# Patient Record
Sex: Female | Born: 1960 | Race: White | Hispanic: No | Smoking: Never smoker
Health system: Southern US, Community
[De-identification: ages and names within clinical notes are randomized; demographics above are authoritative.]

## PROBLEM LIST (undated history)

## (undated) DIAGNOSIS — C801 Malignant (primary) neoplasm, unspecified: Secondary | ICD-10-CM

## (undated) DIAGNOSIS — Z923 Personal history of irradiation: Secondary | ICD-10-CM

## (undated) DIAGNOSIS — D219 Benign neoplasm of connective and other soft tissue, unspecified: Secondary | ICD-10-CM

## (undated) DIAGNOSIS — I10 Essential (primary) hypertension: Secondary | ICD-10-CM

## (undated) DIAGNOSIS — N879 Dysplasia of cervix uteri, unspecified: Secondary | ICD-10-CM

## (undated) DIAGNOSIS — J45909 Unspecified asthma, uncomplicated: Secondary | ICD-10-CM

## (undated) HISTORY — DX: Malignant (primary) neoplasm, unspecified: C80.1

## (undated) HISTORY — DX: Dysplasia of cervix uteri, unspecified: N87.9

## (undated) HISTORY — PX: PILONIDAL CYST EXCISION: SHX744

## (undated) HISTORY — PX: BREAST SURGERY: SHX581

## (undated) HISTORY — DX: Benign neoplasm of connective and other soft tissue, unspecified: D21.9

## (undated) HISTORY — DX: Essential (primary) hypertension: I10

---

## 1998-08-29 ENCOUNTER — Other Ambulatory Visit: Admission: RE | Admit: 1998-08-29 | Discharge: 1998-08-29 | Payer: Self-pay | Admitting: Gynecology

## 1999-10-25 ENCOUNTER — Other Ambulatory Visit: Admission: RE | Admit: 1999-10-25 | Discharge: 1999-10-25 | Payer: Self-pay | Admitting: Gynecology

## 2000-10-28 ENCOUNTER — Other Ambulatory Visit: Admission: RE | Admit: 2000-10-28 | Discharge: 2000-10-28 | Payer: Self-pay | Admitting: Gynecology

## 2001-12-16 ENCOUNTER — Other Ambulatory Visit: Admission: RE | Admit: 2001-12-16 | Discharge: 2001-12-16 | Payer: Self-pay | Admitting: Gynecology

## 2002-09-30 ENCOUNTER — Other Ambulatory Visit: Admission: RE | Admit: 2002-09-30 | Discharge: 2002-09-30 | Payer: Self-pay | Admitting: Gynecology

## 2003-05-12 ENCOUNTER — Other Ambulatory Visit: Admission: RE | Admit: 2003-05-12 | Discharge: 2003-05-12 | Payer: Self-pay | Admitting: Gynecology

## 2004-04-23 ENCOUNTER — Emergency Department (HOSPITAL_COMMUNITY): Admission: EM | Admit: 2004-04-23 | Discharge: 2004-04-23 | Payer: Self-pay | Admitting: Emergency Medicine

## 2004-06-08 ENCOUNTER — Other Ambulatory Visit: Admission: RE | Admit: 2004-06-08 | Discharge: 2004-06-08 | Payer: Self-pay | Admitting: Gynecology

## 2005-08-21 ENCOUNTER — Other Ambulatory Visit: Admission: RE | Admit: 2005-08-21 | Discharge: 2005-08-21 | Payer: Self-pay | Admitting: Gynecology

## 2006-09-23 ENCOUNTER — Other Ambulatory Visit: Admission: RE | Admit: 2006-09-23 | Discharge: 2006-09-23 | Payer: Self-pay | Admitting: Gynecology

## 2007-05-08 ENCOUNTER — Other Ambulatory Visit: Admission: RE | Admit: 2007-05-08 | Discharge: 2007-05-08 | Payer: Self-pay | Admitting: Gynecology

## 2007-10-13 ENCOUNTER — Other Ambulatory Visit: Admission: RE | Admit: 2007-10-13 | Discharge: 2007-10-13 | Payer: Self-pay | Admitting: Gynecology

## 2008-12-23 ENCOUNTER — Encounter: Payer: Self-pay | Admitting: Gynecology

## 2008-12-23 ENCOUNTER — Other Ambulatory Visit: Admission: RE | Admit: 2008-12-23 | Discharge: 2008-12-23 | Payer: Self-pay | Admitting: Gynecology

## 2008-12-23 ENCOUNTER — Ambulatory Visit: Payer: Self-pay | Admitting: Gynecology

## 2010-02-21 ENCOUNTER — Other Ambulatory Visit: Admission: RE | Admit: 2010-02-21 | Discharge: 2010-02-21 | Payer: Self-pay | Admitting: Gynecology

## 2010-02-21 ENCOUNTER — Ambulatory Visit: Payer: Self-pay | Admitting: Gynecology

## 2010-03-01 ENCOUNTER — Ambulatory Visit: Payer: Self-pay | Admitting: Gynecology

## 2011-02-25 ENCOUNTER — Other Ambulatory Visit: Payer: Self-pay | Admitting: *Deleted

## 2011-02-25 DIAGNOSIS — IMO0001 Reserved for inherently not codable concepts without codable children: Secondary | ICD-10-CM

## 2011-02-25 MED ORDER — NORETHIN ACE-ETH ESTRAD-FE 1-20 MG-MCG(24) PO TABS: 1.0000 | ORAL_TABLET | Freq: Every day | ORAL | Status: DC

## 2011-02-27 ENCOUNTER — Telehealth: Payer: Self-pay | Admitting: *Deleted

## 2011-03-05 NOTE — Telephone Encounter (Signed)
PT CALLED BACK TO ASK FOR COUPON FOR LOESTRIN COUPON. PT INFORMED THAT NO COUPONS IN OFFICE ANYMORE. I TOLD PT SHE COULD PROBABLY FIND ONE ON-LINE. PT STATES SHE WILL DO THIS.

## 2011-03-20 ENCOUNTER — Other Ambulatory Visit: Payer: Self-pay | Admitting: Gynecology

## 2011-04-12 ENCOUNTER — Other Ambulatory Visit (HOSPITAL_COMMUNITY)
Admission: RE | Admit: 2011-04-12 | Discharge: 2011-04-12 | Disposition: A | Discharge status: Home / Self Care | Payer: 59 | Source: Ambulatory Visit | Attending: Gynecology | Admitting: Gynecology

## 2011-04-12 ENCOUNTER — Encounter: Payer: Self-pay | Admitting: Gynecology

## 2011-04-12 ENCOUNTER — Ambulatory Visit (INDEPENDENT_AMBULATORY_CARE_PROVIDER_SITE_OTHER): Payer: 59 | Admitting: Gynecology

## 2011-04-12 VITALS — BP 140/80 | Ht 65.25 in | Wt 133.0 lb

## 2011-04-12 DIAGNOSIS — Z01419 Encounter for gynecological examination (general) (routine) without abnormal findings: Secondary | ICD-10-CM | POA: Insufficient documentation

## 2011-04-12 DIAGNOSIS — R82998 Other abnormal findings in urine: Secondary | ICD-10-CM

## 2011-04-12 DIAGNOSIS — Z131 Encounter for screening for diabetes mellitus: Secondary | ICD-10-CM

## 2011-04-12 DIAGNOSIS — N63 Unspecified lump in unspecified breast: Secondary | ICD-10-CM

## 2011-04-12 DIAGNOSIS — Z13220 Encounter for screening for lipoid disorders: Secondary | ICD-10-CM

## 2011-04-12 DIAGNOSIS — Z3041 Encounter for surveillance of contraceptive pills: Secondary | ICD-10-CM

## 2011-04-12 MED ORDER — NORETHIN ACE-ETH ESTRAD-FE 1-20 MG-MCG(24) PO TABS: 1.0000 | ORAL_TABLET | Freq: Every day | ORAL | Status: DC

## 2011-04-12 NOTE — Progress Notes (Signed)
Amy Fisher Sep 01, 1960 [id number]        50 y.o.  for annual exam.  On oral contraceptives without complaints. 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 In 2009 she had ASCUS. In 2010 and 2011 there were normal.  Past medical history,surgical history, medications, allergies, family history and social history were all reviewed and documented in the EPIC chart. ROS:  Was performed and pertinent positives and negatives are included in the history.  Exam: chaperone present Filed Vitals:   04/12/11 0940  BP: 140/80   General appearance  Normal Skin grossly normal Head/Neck normal with no cervical or supraclavicular adenopathy thyroid normal Lungs  clear Cardiac RR, without RMG Abdominal  soft, nontender, without masses, organomegaly or hernia EXT Mole left 4th toe Breasts  examined lying and sitting. Right without masses, retractions, discharge or axillary adenopathy.  Left with classic sebaceous cyst 3:00 position of the areola proximately 7 mm. Mobile nontender. Breast otherwise without masses retractions discharge axillary adenopathy. Pelvic  Ext/BUS/vagina  normal   Cervix  normal  Pap done  Uterus  anteverted, normal size, shape and contour, midline and mobile nontender   Adnexa  Without masses or tenderness    Anus and perineum  normal   Rectovaginal  normal sphincter tone without palpated masses or tenderness.    Assessment/Plan:  50 y.o. female for annual exam.    #1 Left breast mass. Exam of systems classic sebaceous cyst. Patient states it's been there for years although I never noted this before on exam. She has not had a mammography in several years. Will schedule a mammography as well as ultrasound. I discussed various options to include observation and excision.  As she has stated, that It has been present for a number of years she prefers just to watch it assuming that the mammogram and ultrasound are consistent with benign changes. #2 Birth control.  Patient is on low-dose oral contraceptives and wants to continue. I discussed the risks to include stroke, heart attack, DVT. Patient understands accepts and wants to continue. I refilled her Loestrin 24x1 year. She does have a mildly elevated blood pressure.  I asked her to have it rechecked in a nonexam situation to make sure it's normal and she agrees to do so. #3 Health maintenance. Will check baseline CBC glucose lipid profile and urinalysis. She has a mole on her left toe she has had her whole life has remained unchanged and she is continuing to observe this. Assuming she continues well from a gynecologic standpoint and that her breast studies are all benign she will see me in a year sooner as needed    Dara Lords MD, 10:07 AM 04/12/2011

## 2011-05-03 ENCOUNTER — Ambulatory Visit
Admission: RE | Admit: 2011-05-03 | Discharge: 2011-05-03 | Disposition: A | Discharge status: Home / Self Care | Payer: 59 | Source: Ambulatory Visit | Attending: Gynecology | Admitting: Gynecology

## 2011-05-03 DIAGNOSIS — N63 Unspecified lump in unspecified breast: Secondary | ICD-10-CM

## 2012-06-03 ENCOUNTER — Ambulatory Visit (INDEPENDENT_AMBULATORY_CARE_PROVIDER_SITE_OTHER): Payer: 59 | Admitting: Gynecology

## 2012-06-03 ENCOUNTER — Encounter: Payer: Self-pay | Admitting: Gynecology

## 2012-06-03 VITALS — BP 136/90 | Ht 65.0 in | Wt 134.0 lb

## 2012-06-03 DIAGNOSIS — Z13220 Encounter for screening for lipoid disorders: Secondary | ICD-10-CM

## 2012-06-03 DIAGNOSIS — Z01419 Encounter for gynecological examination (general) (routine) without abnormal findings: Secondary | ICD-10-CM

## 2012-06-03 DIAGNOSIS — Z3041 Encounter for surveillance of contraceptive pills: Secondary | ICD-10-CM

## 2012-06-03 DIAGNOSIS — D259 Leiomyoma of uterus, unspecified: Secondary | ICD-10-CM

## 2012-06-03 LAB — CBC WITH DIFFERENTIAL/PLATELET
Eosinophils Relative: 3 % (ref 0–5)
Lymphocytes Relative: 32 % (ref 12–46)
Lymphs Abs: 1.7 10*3/uL (ref 0.7–4.0)
MCV: 87.1 fL (ref 78.0–100.0)
Neutro Abs: 3.2 10*3/uL (ref 1.7–7.7)
Neutrophils Relative %: 60 % (ref 43–77)
Platelets: 192 10*3/uL (ref 150–400)
RBC: 5.11 MIL/uL (ref 3.87–5.11)
WBC: 5.3 10*3/uL (ref 4.0–10.5)

## 2012-06-03 LAB — COMPREHENSIVE METABOLIC PANEL
ALT: 13 U/L (ref 0–35)
Albumin: 4.6 g/dL (ref 3.5–5.2)
CO2: 25 mEq/L (ref 19–32)
Calcium: 9.6 mg/dL (ref 8.4–10.5)
Chloride: 105 mEq/L (ref 96–112)
Potassium: 4.5 mEq/L (ref 3.5–5.3)
Sodium: 138 mEq/L (ref 135–145)
Total Protein: 6.8 g/dL (ref 6.0–8.3)

## 2012-06-03 LAB — LIPID PANEL: Cholesterol: 190 mg/dL (ref 0–200)

## 2012-06-03 MED ORDER — NORETHIN ACE-ETH ESTRAD-FE 1-20 MG-MCG(24) PO TABS: 1.0000 | ORAL_TABLET | Freq: Every day | ORAL | Status: DC

## 2012-06-03 NOTE — Progress Notes (Signed)
Amy Fisher 1960/08/15 [id number]        51 y.o.  G0P0 for annual exam.  Several issues that are below.  Past medical history,surgical history, medications, allergies, family history and social history were all reviewed and documented in the EPIC chart. ROS:  Was performed and pertinent positives and negatives are included in the history.  Exam: Sherrilyn Rist assistant Filed Vitals:   06/03/12 0859  BP: 136/90  Height: 5\' 5"  (1.651 m)  Weight: 134 lb (60.782 kg)   General appearance  Normal Skin grossly normal Head/Neck normal with no cervical or supraclavicular adenopathy thyroid normal Lungs  clear Cardiac RR, without RMG Abdominal  soft, nontender, without masses, organomegaly or hernia Breasts  examined lying and sitting. Right without masses, retractions, discharge or axillary adenopathy.  Left with small bluish cyst 4:00 position 1 fingerbreadth off the areola. No other masses retractions discharge adenopathy. Pelvic  Ext/BUS/vagina  normal   Cervix  normal   Uterus  anteverted, normal size, shape and contour, midline and mobile nontender   Adnexa  Without masses or tenderness    Anus and perineum  normal   Rectovaginal  normal sphincter tone without palpated masses or tenderness.    Assessment/Plan:  51 y.o. G0P0 female for annual exam.   1. Birth control. Patient on Loestrin 120 equivalents. Reviewed risks to include stroke heart attack DVT. Alternatives to include pill patch ring Depo-Provera and Implanon IUD discussed. Blood pressure mildly elevated at 136/90. She does have some labile blood pressures as she checks them at home and they'll oscillate between normal to mildly elevated. Recommended she strongly consider Mirena IUD as an alternative birth control. I reviewed the risks benefits to include infection, perforation/migration requiring surgery to remove, failure risk. Insertional process also discussed.  I did refill her birth control pills at this point as she does want  to continue them. She's going to continue to monitor her blood pressure. If it does remain elevated she follow up Dr. Foy Guadalajara who is her primary physician.  She will follow for IUD placement if she decides to pursue this. 2. Elevated blood pressure. Plan as noted above. 3. Leiomyoma. 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 Had an episode of irregular bleeding this summer. Now having regular menses. Will check baseline ultrasound. 4. Mammography. Patient overdo and she knows to schedule and agrees to do so. SBE reviewed. Small cutaneous cyst left breast evaluated with ultrasound last year consistent with benign etiology. Patient will continue to follow, as long as it remains unchanged she is comfortable following. 5. Pap smear.  No Pap smear done today.  History of LGSIL. 2008, ascus 2009, normal 2010, 2011, 2012.  Plan every 3 year screening. 6. Health maintenance. Baseline CBC comprehensive metabolic panel lipid profile urinalysis ordered. Follow up for ultrasound, possible Mirena IUD.    Dara Lords MD, 9:42 AM 06/03/2012

## 2012-06-03 NOTE — Patient Instructions (Addendum)
Followup for ultrasound as scheduled. Followup in one year for annual exam. 

## 2012-06-04 LAB — URINALYSIS W MICROSCOPIC + REFLEX CULTURE
Bilirubin Urine: NEGATIVE
Crystals: NONE SEEN
Glucose, UA: NEGATIVE mg/dL
Leukocytes, UA: NEGATIVE
Specific Gravity, Urine: 1.029 (ref 1.005–1.030)

## 2012-06-10 ENCOUNTER — Telehealth: Payer: Self-pay | Admitting: Gynecology

## 2012-06-10 ENCOUNTER — Ambulatory Visit (INDEPENDENT_AMBULATORY_CARE_PROVIDER_SITE_OTHER): Payer: 59 | Admitting: Gynecology

## 2012-06-10 ENCOUNTER — Encounter: Payer: Self-pay | Admitting: Gynecology

## 2012-06-10 ENCOUNTER — Ambulatory Visit (INDEPENDENT_AMBULATORY_CARE_PROVIDER_SITE_OTHER): Payer: 59

## 2012-06-10 ENCOUNTER — Other Ambulatory Visit: Payer: Self-pay | Admitting: Gynecology

## 2012-06-10 DIAGNOSIS — D251 Intramural leiomyoma of uterus: Secondary | ICD-10-CM

## 2012-06-10 DIAGNOSIS — D259 Leiomyoma of uterus, unspecified: Secondary | ICD-10-CM

## 2012-06-10 DIAGNOSIS — N925 Other specified irregular menstruation: Secondary | ICD-10-CM

## 2012-06-10 DIAGNOSIS — N949 Unspecified condition associated with female genital organs and menstrual cycle: Secondary | ICD-10-CM

## 2012-06-10 DIAGNOSIS — Z3049 Encounter for surveillance of other contraceptives: Secondary | ICD-10-CM

## 2012-06-10 DIAGNOSIS — N852 Hypertrophy of uterus: Secondary | ICD-10-CM

## 2012-06-10 MED ORDER — LEVONORGESTREL 20 MCG/24HR IU IUD: INTRAUTERINE_SYSTEM | Freq: Once | INTRAUTERINE | Status: DC

## 2012-06-10 NOTE — Progress Notes (Signed)
Patient also for ultrasound. Has history of leiomyoma and some irregular bleeding this past summer but now is regular. Ultrasound: uterus overall normal to generous size. Endometrium 2.9 mm. Single 50 mm myoma. Right/left ovaries normal.  Assessment and plan: Single myoma overall stable in size. Patient has decided she was to proceed with Mirena IUD per prior 06/03/2012 discussion. I reviewed was involved with the insertional process and the risks to include perforation/migration requiring surgery to remove, infection both immediate and long-term, failure with pregnancy. The issues of myoma distorting the cavity possibly making placement more difficult/increasing expulsion rate/decreased efficacy rate all reviewed. Patient will schedule with her menses and follow up for this.

## 2012-06-10 NOTE — Patient Instructions (Signed)
Followup for IUD placement.  Intrauterine Device Insertion Most often, an intrauterine device (IUD) is inserted into the uterus to prevent pregnancy. There are 2 types of IUDs available:  Copper IUD. This type of IUD creates an environment that is not favorable to sperm survival. The mechanism of action of the copper IUD is not known for certain. It can stay in place for 10 years.  Hormone IUD. This type of IUD contains the hormone progestin (synthetic progesterone). The progestin thickens the cervical mucus and prevents sperm from entering the uterus, and it also thins the uterine lining. There is no evidence that the hormone IUD prevents implantation. The hormone IUD can stay in place for up to 5 years. An IUD is the most cost-effective birth control if left in place for the full duration. It may be removed at any time. LET YOUR CAREGIVER KNOW ABOUT:  Sensitivity to metals.  Medicines taken including herbs, eyedrops, over-the-counter medicines, and creams.  Use of steroids (by mouth or creams).  Previous problems with anesthetics or numbing medicine.  Previous gynecological surgery.  History of blood clots or clotting disorders.  Possibility of pregnancy.  Menstrual irregularities.  Concerns regarding unusual vaginal discharge or odors.  Previous experience with an IUD.  Other health problems. RISKS AND COMPLICATIONS  Accidental puncture (perforation) of the uterus.  Accidental placement of the IUD either in the muscle layer of the uterus (myometrium) or outside the uterus. If this happen, the IUD can be found essentially floating around the bowels. When this happens, the IUD must be taken out surgically.  The IUD may fall out of the uterus (expulsion). This is more common in women who have recently had a child.   Pregnancy in the fallopian tube (ectopic). BEFORE THE PROCEDURE  Schedule the IUD insertion for when you will have your menstrual period or right after, to  make sure you are not pregnant. Placement of the IUD is better tolerated shortly after a menstrual cycle.  You may need to take tests or be examined to make sure you are not pregnant.  You may be required to take a pregnancy test.  You may be required to get checked for sexually transmitted infections (STIs) prior to placement. Placing an IUD in someone who has an infection can make an infection worse.  You may be given a pain reliever to take 1 or 2 hours before the procedure.  An exam will be performed to determine the size and position of your uterus.  Ask your caregiver about changing or stopping your regular medicines. PROCEDURE   A tool (speculum) is placed in the vagina. This allows your caregiver to see the lower part of the uterus (cervix).  The cervix is prepped with a medicine that lowers the risk of infection.  You may be given a medicine to numb each side of the cervix (intracervical or paracervical block). This is used to block and control any discomfort with insertion.  A tool (uterine sound) is inserted into the uterus to determine the length of the uterine cavity and the direction the uterus may be tilted.  A slim instrument (IUD inserter) is inserted through the cervical canal and into your uterus.  The IUD is placed in the uterine cavity and the insertion device is removed.  The nylon string that is attached to the IUD, and used for eventual IUD removal, is trimmed. It is trimmed so that it lays high in the vagina, just outside the cervix. AFTER THE PROCEDURE  You   may have bleeding after the procedure. This is normal. It varies from light spotting for a few days to menstrual-like bleeding.  You may have mild cramping.  Practice checking the string coming out of the cervix to make sure the IUD remains in the uterus. If you cannot feel the string, you should schedule a "string check" with your caregiver.  If you had a hormone IUD inserted, expect that your period  may be lighter or nonexistent within a year's time (though this is not always the case). There may be delayed fertility with the hormone IUD as a result of its progesterone effect. When you are ready to become pregnant, it is suggested to have the IUD removed up to 1 year in advance.  Yearly exams are advised. Document Released: 03/06/2011 Document Revised: 09/30/2011 Document Reviewed: 03/06/2011 [employer] Patient Information 2013 [employer].  

## 2012-06-10 NOTE — Telephone Encounter (Signed)
I spoke w/pt today who asked to have Mirena benefits checked. Per Jacquenette Shone at [facility] it is covered at 100%,no copay,deductible or coinsurance. Pt wants to proceed and will call first day of next cycle.WL

## 2012-06-25 ENCOUNTER — Encounter: Payer: Self-pay | Admitting: Gynecology

## 2012-06-25 ENCOUNTER — Ambulatory Visit (INDEPENDENT_AMBULATORY_CARE_PROVIDER_SITE_OTHER): Payer: 59 | Admitting: Gynecology

## 2012-06-25 DIAGNOSIS — Z30430 Encounter for insertion of intrauterine contraceptive device: Secondary | ICD-10-CM

## 2012-06-25 MED ORDER — LEVONORGESTREL 20 MCG/24HR IU IUD: INTRAUTERINE_SYSTEM | Freq: Once | INTRAUTERINE | Status: DC

## 2012-06-25 NOTE — Progress Notes (Signed)
Patient presents for Mirena IUD placement. She has read through the booklet, has no contraindications and signed the consent form. She currently is on a normal menses during her pill free week.  I reviewed the insertional process with her as well as the risks to include infection either immediate or long-term, uterine perforation or migration requiring surgery to remove, other complications such as pain, hormonal side effects and possibilities of failure with subsequent pregnancy.  We again discussed her leiomyoma and the question as to whether the IUD will be less efficacious due to endometrial distortion from the myoma as well as increased expulsion rate and she understands these issues and accepts the risks.  On ultrasound there was some deviation of the cavity by the myoma but was not intracavitary or showing significant distortion.  Exam with Sherrilyn Rist assistant Pelvic: External BUS vagina normal. Cervix normal light menses. Uterus anteverted generous in size, midline mobile nontender. Adnexa without masses or tenderness.  Procedure: The cervix was cleansed with Betadine, anterior lip grasped with a single-tooth tenaculum, the uterus was sounded and a Mirena IUD was placed according to manufacturer's recommendations without difficulty. The strings were trimmed. The patient tolerated well and will follow up in one month for a postinsertional check.  Lot number: [id number]

## 2012-06-25 NOTE — Patient Instructions (Signed)
Follow up in one month for post IUD insertion exam.  Intrauterine Device Insertion Most often, an intrauterine device (IUD) is inserted into the uterus to prevent pregnancy. There are 2 types of IUDs available:  Copper IUD. This type of IUD creates an environment that is not favorable to sperm survival. The mechanism of action of the copper IUD is not known for certain. It can stay in place for 10 years.  Hormone IUD. This type of IUD contains the hormone progestin (synthetic progesterone). The progestin thickens the cervical mucus and prevents sperm from entering the uterus, and it also thins the uterine lining. There is no evidence that the hormone IUD prevents implantation. The hormone IUD can stay in place for up to 5 years. An IUD is the most cost-effective birth control if left in place for the full duration. It may be removed at any time. LET YOUR CAREGIVER KNOW ABOUT:  Sensitivity to metals.  Medicines taken including herbs, eyedrops, over-the-counter medicines, and creams.  Use of steroids (by mouth or creams).  Previous problems with anesthetics or numbing medicine.  Previous gynecological surgery.  History of blood clots or clotting disorders.  Possibility of pregnancy.  Menstrual irregularities.  Concerns regarding unusual vaginal discharge or odors.  Previous experience with an IUD.  Other health problems. RISKS AND COMPLICATIONS  Accidental puncture (perforation) of the uterus.  Accidental placement of the IUD either in the muscle layer of the uterus (myometrium) or outside the uterus. If this happen, the IUD can be found essentially floating around the bowels. When this happens, the IUD must be taken out surgically.  The IUD may fall out of the uterus (expulsion). This is more common in women who have recently had a child.   Pregnancy in the fallopian tube (ectopic). BEFORE THE PROCEDURE  Schedule the IUD insertion for when you will have your menstrual  period or right after, to make sure you are not pregnant. Placement of the IUD is better tolerated shortly after a menstrual cycle.  You may need to take tests or be examined to make sure you are not pregnant.  You may be required to take a pregnancy test.  You may be required to get checked for sexually transmitted infections (STIs) prior to placement. Placing an IUD in someone who has an infection can make an infection worse.  You may be given a pain reliever to take 1 or 2 hours before the procedure.  An exam will be performed to determine the size and position of your uterus.  Ask your caregiver about changing or stopping your regular medicines. PROCEDURE   A tool (speculum) is placed in the vagina. This allows your caregiver to see the lower part of the uterus (cervix).  The cervix is prepped with a medicine that lowers the risk of infection.  You may be given a medicine to numb each side of the cervix (intracervical or paracervical block). This is used to block and control any discomfort with insertion.  A tool (uterine sound) is inserted into the uterus to determine the length of the uterine cavity and the direction the uterus may be tilted.  A slim instrument (IUD inserter) is inserted through the cervical canal and into your uterus.  The IUD is placed in the uterine cavity and the insertion device is removed.  The nylon string that is attached to the IUD, and used for eventual IUD removal, is trimmed. It is trimmed so that it lays high in the vagina, just outside the  cervix. AFTER THE PROCEDURE  You may have bleeding after the procedure. This is normal. It varies from light spotting for a few days to menstrual-like bleeding.  You may have mild cramping.  Practice checking the string coming out of the cervix to make sure the IUD remains in the uterus. If you cannot feel the string, you should schedule a "string check" with your caregiver.  If you had a hormone IUD  inserted, expect that your period may be lighter or nonexistent within a year's time (though this is not always the case). There may be delayed fertility with the hormone IUD as a result of its progesterone effect. When you are ready to become pregnant, it is suggested to have the IUD removed up to 1 year in advance.  Yearly exams are advised. Document Released: 03/06/2011 Document Revised: 09/30/2011 Document Reviewed: 03/06/2011 [facility] Patient Information 2013 [place], [place].

## 2012-07-01 ENCOUNTER — Other Ambulatory Visit: Payer: Self-pay | Admitting: Gynecology

## 2012-07-01 DIAGNOSIS — Z30430 Encounter for insertion of intrauterine contraceptive device: Secondary | ICD-10-CM

## 2012-07-21 ENCOUNTER — Encounter: Payer: Self-pay | Admitting: Gynecology

## 2012-07-21 ENCOUNTER — Ambulatory Visit (INDEPENDENT_AMBULATORY_CARE_PROVIDER_SITE_OTHER): Payer: 59 | Admitting: Gynecology

## 2012-07-21 VITALS — BP 130/82

## 2012-07-21 DIAGNOSIS — Z30431 Encounter for routine checking of intrauterine contraceptive device: Secondary | ICD-10-CM

## 2012-07-21 NOTE — Patient Instructions (Signed)
Intrauterine Device Insertion Most often, an intrauterine device (IUD) is inserted into the uterus to prevent pregnancy. There are 2 types of IUDs available:  Copper IUD. This type of IUD creates an environment that is not favorable to sperm survival. The mechanism of action of the copper IUD is not known for certain. It can stay in place for 10 years.  Hormone IUD. This type of IUD contains the hormone progestin (synthetic progesterone). The progestin thickens the cervical mucus and prevents sperm from entering the uterus, and it also thins the uterine lining. There is no evidence that the hormone IUD prevents implantation. The hormone IUD can stay in place for up to 5 years. An IUD is the most cost-effective birth control if left in place for the full duration. It may be removed at any time. LET YOUR CAREGIVER KNOW ABOUT:  Sensitivity to metals.  Medicines taken including herbs, eyedrops, over-the-counter medicines, and creams.  Use of steroids (by mouth or creams).  Previous problems with anesthetics or numbing medicine.  Previous gynecological surgery.  History of blood clots or clotting disorders.  Possibility of pregnancy.  Menstrual irregularities.  Concerns regarding unusual vaginal discharge or odors.  Previous experience with an IUD.  Other health problems. RISKS AND COMPLICATIONS  Accidental puncture (perforation) of the uterus.  Accidental placement of the IUD either in the muscle layer of the uterus (myometrium) or outside the uterus. If this happen, the IUD can be found essentially floating around the bowels. When this happens, the IUD must be taken out surgically.  The IUD may fall out of the uterus (expulsion). This is more common in women who have recently had a child.   Pregnancy in the fallopian tube (ectopic). BEFORE THE PROCEDURE  Schedule the IUD insertion for when you will have your menstrual period or right after, to make sure you are not pregnant.  Placement of the IUD is better tolerated shortly after a menstrual cycle.  You may need to take tests or be examined to make sure you are not pregnant.  You may be required to take a pregnancy test.  You may be required to get checked for sexually transmitted infections (STIs) prior to placement. Placing an IUD in someone who has an infection can make an infection worse.  You may be given a pain reliever to take 1 or 2 hours before the procedure.  An exam will be performed to determine the size and position of your uterus.  Ask your caregiver about changing or stopping your regular medicines. PROCEDURE   A tool (speculum) is placed in the vagina. This allows your caregiver to see the lower part of the uterus (cervix).  The cervix is prepped with a medicine that lowers the risk of infection.  You may be given a medicine to numb each side of the cervix (intracervical or paracervical block). This is used to block and control any discomfort with insertion.  A tool (uterine sound) is inserted into the uterus to determine the length of the uterine cavity and the direction the uterus may be tilted.  A slim instrument (IUD inserter) is inserted through the cervical canal and into your uterus.  The IUD is placed in the uterine cavity and the insertion device is removed.  The nylon string that is attached to the IUD, and used for eventual IUD removal, is trimmed. It is trimmed so that it lays high in the vagina, just outside the cervix. AFTER THE PROCEDURE  You may have bleeding after the  attached to the IUD, and used for eventual IUD removal, is trimmed. It is trimmed so that it lays high in the vagina, just outside the cervix.  AFTER THE PROCEDURE  · You may have bleeding after the procedure. This is normal. It varies from light spotting for a few days to menstrual-like bleeding.   · You may have mild cramping.  · Practice checking the string coming out of the cervix to make sure the IUD remains in the uterus. If you cannot feel the string, you should schedule a "string check" with your caregiver.  · If you had a hormone IUD inserted, expect that your period may be lighter or nonexistent  within a year's time (though this is not always the case). There may be delayed fertility with the hormone IUD as a result of its progesterone effect. When you are ready to become pregnant, it is suggested to have the IUD removed up to 1 year in advance.  · Yearly exams are advised.  Document Released: 03/06/2011 Document Revised: 09/30/2011 Document Reviewed: 03/06/2011  [employer]® Patient Information ©2013 [employer].

## 2012-07-21 NOTE — Progress Notes (Signed)
IUD follow up exam. Doing well without complaints.  Exam with [facility] assistant External BUS vagina normal. Cervix normal IUD string not visualized but palpated. Follow up colposcopy with string visualized within the external os. Uterus normal size midline mobile nontender. Adnexa without masses or tenderness  Assessment and plan: Normal IUD follow up exam. Follow up November 2014 for annual exam, sooner if needed.

## 2012-09-05 ENCOUNTER — Other Ambulatory Visit: Payer: Self-pay

## 2013-05-27 ENCOUNTER — Other Ambulatory Visit: Payer: Self-pay

## 2013-07-20 ENCOUNTER — Ambulatory Visit (INDEPENDENT_AMBULATORY_CARE_PROVIDER_SITE_OTHER): Payer: 59 | Admitting: Gynecology

## 2013-07-20 ENCOUNTER — Other Ambulatory Visit (HOSPITAL_COMMUNITY)
Admission: RE | Admit: 2013-07-20 | Discharge: 2013-07-20 | Disposition: A | Discharge status: Home / Self Care | Payer: 59 | Source: Ambulatory Visit | Attending: Gynecology | Admitting: Gynecology

## 2013-07-20 ENCOUNTER — Encounter: Payer: Self-pay | Admitting: Gynecology

## 2013-07-20 VITALS — BP 122/66 | Ht 65.0 in | Wt 135.0 lb

## 2013-07-20 DIAGNOSIS — Z01419 Encounter for gynecological examination (general) (routine) without abnormal findings: Secondary | ICD-10-CM | POA: Insufficient documentation

## 2013-07-20 DIAGNOSIS — Z30431 Encounter for routine checking of intrauterine contraceptive device: Secondary | ICD-10-CM

## 2013-07-20 DIAGNOSIS — Z1151 Encounter for screening for human papillomavirus (HPV): Secondary | ICD-10-CM | POA: Insufficient documentation

## 2013-07-20 NOTE — Progress Notes (Signed)
Amy Fisher 1960-08-16 [id number]        52 y.o.  G0P0 for annual exam.  Doing well without complaints.  Past medical history,surgical history, problem list, medications, allergies, family history and social history were all reviewed and documented in the EPIC chart.  ROS:  Performed and pertinent positives and negatives are included in the history, assessment and plan .  Exam: Kim assistant Filed Vitals:   07/20/13 1559  BP: 122/66  Height: 5\' 5"  (1.651 m)  Weight: 135 lb (61.236 kg)   General appearance  Normal Skin grossly normal Head/Neck normal with no cervical or supraclavicular adenopathy thyroid normal Lungs  clear Cardiac RR, without RMG Abdominal  soft, nontender, without masses, organomegaly or hernia Breasts  examined lying and sitting without masses, retractions, discharge or axillary adenopathy. Pelvic  Ext/BUS/vagina  Normal   Cervix  Normal with IUD string visualized with colposcope. Pap/HPV done.  Uterus  anteverted, normal size, shape and contour, midline and mobile nontender   Adnexa  Without masses or tenderness    Anus and perineum  Normal   Rectovaginal  Normal sphincter tone without palpated masses or tenderness.    Assessment/Plan:  52 y.o. G0P0 female for annual exam.   1. Mirena IUD 06/2012. Doing well with scant to absent menses. IUD string visualized within the external os with the colposcope. 2. Pap smear. Pap/HPV today. History of LGSIL 2008, as this 2009 with normal Pap smears 2010, 2011 and 2012. Assuming Pap smear normal in plan less frequent screening interval. 3. Leiomyoma. History of 5 cm leiomyoma. Uterus palpates overall normal grossly. Plan expectant management with annual exams. 4. Mammography 2012. Strongly recommended screening mammogram now and she agrees to schedule. Mother developed breast cancer at age 48. No other family history of breast cancer. Benefits of early detection reviewed. SBE monthly review. 5. Colonoscopy never.  Recommended scheduling baseline now as she has turned 52. Names and numbers provided. 6. Health maintenance. Had normal lab work last year. Changing insurances and prefers to wait and have done next year. Plans to have done through Dr. Pablo Lawrence office. Assuming she continues well from a gynecologic standpoint, followup in one year, sooner as needed.   Note: This document was prepared with digital dictation and possible smart phrase technology. Any transcriptional errors that result from this process are unintentional.   Dara Lords MD, 4:33 PM 07/20/2013

## 2013-07-20 NOTE — Patient Instructions (Addendum)
Schedule colonoscopy with [facility] at [phone] or [facility] at [phone]  Call to Schedule your mammogram  Facilities in [place]: 1)  [facility], [address] Phone: [phone] 2)  [facility], [address] Phone: [phone] 3)  Dr. Bertrand at [facility]  [address] Phone: [phone]     Mammogram A mammogram is an X-ray test to find changes in a woman's breast. You should get a mammogram if:  You are 40 years of age or older  You have risk factors.   Your doctor recommends that you have one.  BEFORE THE TEST  Do not schedule the test the week before your period, especially if your breasts are sore during this time.  On the day of your mammogram:  Wash your breasts and armpits well. After washing, do not put on any deodorant or talcum powder on until after your test.   Eat and drink as you usually do.   Take your medicines as usual.   If you are diabetic and take insulin, make sure you:   Eat before coming for your test.   Take your insulin as usual.   If you cannot keep your appointment, call before the appointment to cancel. Schedule another appointment.  TEST  You will need to undress from the waist up. You will put on a hospital gown.   Your breast will be put on the mammogram machine, and it will press firmly on your breast with a piece of plastic called a compression paddle. This will make your breast flatter so that the machine can X-ray all parts of your breast.   Both breasts will be X-rayed. Each breast will be X-rayed from above and from the side. An X-ray might need to be taken again if the picture is not good enough.   The mammogram will last about 15 to 30 minutes.  AFTER THE TEST Finding out the results of your test Ask when your test results will be ready. Make sure you get your test  results.  Document Released: 10/04/2008 Document Revised: 06/27/2011 Document Reviewed: 10/04/2008 [employer] Patient Information 2012 [employer].   

## 2013-07-20 NOTE — Addendum Note (Signed)
Addended by: Dayna Barker on: 07/20/2013 04:47 PM   Modules accepted: Orders

## 2015-01-26 ENCOUNTER — Other Ambulatory Visit: Payer: Self-pay | Admitting: Gynecology

## 2015-01-26 DIAGNOSIS — Z1231 Encounter for screening mammogram for malignant neoplasm of breast: Secondary | ICD-10-CM

## 2015-01-31 ENCOUNTER — Ambulatory Visit (HOSPITAL_BASED_OUTPATIENT_CLINIC_OR_DEPARTMENT_OTHER)
Admission: RE | Admit: 2015-01-31 | Discharge: 2015-01-31 | Disposition: A | Discharge status: Home / Self Care | Payer: 59 | Source: Ambulatory Visit | Attending: Gynecology | Admitting: Gynecology

## 2015-01-31 DIAGNOSIS — Z1231 Encounter for screening mammogram for malignant neoplasm of breast: Secondary | ICD-10-CM | POA: Diagnosis present

## 2015-04-06 ENCOUNTER — Encounter: Payer: Self-pay | Admitting: Gynecology

## 2015-04-07 ENCOUNTER — Encounter: Payer: Self-pay | Admitting: Gynecology

## 2015-04-07 ENCOUNTER — Ambulatory Visit (INDEPENDENT_AMBULATORY_CARE_PROVIDER_SITE_OTHER): Payer: 59 | Admitting: Gynecology

## 2015-04-07 VITALS — BP 130/80 | Ht 65.0 in | Wt 132.0 lb

## 2015-04-07 DIAGNOSIS — D251 Intramural leiomyoma of uterus: Secondary | ICD-10-CM

## 2015-04-07 DIAGNOSIS — Z30431 Encounter for routine checking of intrauterine contraceptive device: Secondary | ICD-10-CM

## 2015-04-07 DIAGNOSIS — Z01419 Encounter for gynecological examination (general) (routine) without abnormal findings: Secondary | ICD-10-CM | POA: Diagnosis not present

## 2015-04-07 NOTE — Progress Notes (Signed)
Amy Fisher 1960-10-16 [id number]        54 y.o.  G0P0 for annual exam.  Doing well without complaints  Past medical history,surgical history, problem list, medications, allergies, family history and social history were all reviewed and documented as reviewed in the EPIC chart.  ROS:  Performed with pertinent positives and negatives included in the history, assessment and plan.   Additional significant findings :  none   Exam: Kim Counsellor Vitals:   04/07/15 0800  BP: 130/80  Height: 5\' 5"  (1.651 m)  Weight: 132 lb (59.875 kg)   General appearance:  Normal affect, orientation and appearance. Skin: Grossly normal HEENT: Without gross lesions.  No cervical or supraclavicular adenopathy. Thyroid normal.  Lungs:  Clear without wheezing, rales or rhonchi Cardiac: RR, without RMG Abdominal:  Soft, nontender, without masses, guarding, rebound, organomegaly or hernia Breasts:  Examined lying and sitting without masses, retractions, discharge or axillary adenopathy. Pelvic:  Ext/BUS/vagina normal  Cervix normal. IUD string visualized at external os with colposcope  Uterus anteverted axial, normal size, shape and contour, midline and mobile nontender   Adnexa  Without masses or tenderness    Anus and perineum  Normal   Rectovaginal  Normal sphincter tone without palpated masses or tenderness.    Assessment/Plan:  54 y.o. G0P0 female for annual exam with scant menses, ring IUD.   1. Mirena IUD 06/2012. Doing well with scant menses. String visualized at the os. 2. Leiomyoma. History of 5 cm leiomyoma on ultrasound. Exam shows uterus overall to be normal size. Continue to monitor with annual exams. 3. Mammography 01/2015. Continue with annual mammography. SBE monthly review. 4. Pap smear/HPV negative 2014. No Pap smear done today.  History of LGSIL 2008, ascus 2009, normal Pap smears since then. Plan repeat Pap smear at 3-5 year interval. 5. Colonoscopy never. I again recommended  she schedule a screening colonoscopy as she has turned 52. Patient acknowledges my recommendations. 6. Health maintenance. No routine lab work done. Reports labs done through work screening and all normal. Follow up in one year, sooner as needed.   [name], 8:28 AM 04/07/2015

## 2015-04-07 NOTE — Patient Instructions (Signed)
Schedule your colonoscopy with either:  [facility]   Address: [address], [place], [place] [address]  [phone]    or  [facility]  Address: [address], [place], [place] [address]  Phone:([phone]      You may obtain a copy of any labs that were done today by logging onto MyChart as outlined in the instructions provided with your AVS (after visit summary). The office will not call with normal lab results but certainly if there are any significant abnormalities then we will contact you.   Health Maintenance Adopting a healthy lifestyle and getting preventive care can go a long way to promote health and wellness. Talk with your health care Philipp Callegari about what schedule of regular examinations is right for you. This is a good chance for you to check in with your Kenzey Birkland about disease prevention and staying healthy. In between checkups, there are plenty of things you can do on your own. Experts have done a lot of research about which lifestyle changes and preventive measures are most likely to keep you healthy. Ask your health care Jaramiah Bossard for more information. WEIGHT AND DIET  Eat a healthy diet  Be sure to include plenty of vegetables, fruits, low-fat dairy products, and lean protein.  Do not eat a lot of foods high in solid fats, added sugars, or salt.  Get regular exercise. This is one of the most important things you can do for your health.  Most adults should exercise for at least 150 minutes each week. The exercise should increase your heart rate and make you sweat (moderate-intensity exercise).  Most adults should also do strengthening exercises at least twice a week. This is in addition to the moderate-intensity exercise.  Maintain a healthy weight  Body mass index (BMI) is a measurement that can be used to identify possible weight problems. It estimates body fat based on height and weight. Your health care Jalyiah Shelley can help determine  your BMI and help you achieve or maintain a healthy weight.  For females 22 years of age and older:   A BMI below 18.5 is considered underweight.  A BMI of 18.5 to 24.9 is normal.  A BMI of 25 to 29.9 is considered overweight.  A BMI of 30 and above is considered obese.  Watch levels of cholesterol and blood lipids  You should start having your blood tested for lipids and cholesterol at 52 years of age, then have this test every 5 years.  You may need to have your cholesterol levels checked more often if:  Your lipid or cholesterol levels are high.  You are older than 52 years of age.  You are at high risk for heart disease.  CANCER SCREENING   Lung Cancer  Lung cancer screening is recommended for adults 19-51 years old who are at high risk for lung cancer because of a history of smoking.  A yearly low-dose CT scan of the lungs is recommended for people who:  Currently smoke.  Have quit within the past 15 years.  Have at least a 30-pack-year history of smoking. A pack year is smoking an average of one pack of cigarettes a day for 1 year.  Yearly screening should continue until it has been 15 years since you quit.  Yearly screening should stop if you develop a health problem that would prevent you from having lung cancer treatment.  Breast Cancer  Practice breast self-awareness. This means understanding how your breasts normally appear and  feel.  It also means doing regular breast self-exams. Let your health care Richrd Kuzniar know about any changes, no matter how small.  If you are in your 20s or 30s, you should have a clinical breast exam (CBE) by a health care Lonny Eisen every 1-3 years as part of a regular health exam.  If you are 35 or older, have a CBE every year. Also consider having a breast X-ray (mammogram) every year.  If you have a family history of breast cancer, talk to your health care Neetu Carrozza about genetic screening.  If you are at high risk for breast  cancer, talk to your health care Danisha Brassfield about having an MRI and a mammogram every year.  Breast cancer gene (BRCA) assessment is recommended for women who have family members with BRCA-related cancers. BRCA-related cancers include:  Breast.  Ovarian.  Tubal.  Peritoneal cancers.  Results of the assessment will determine the need for genetic counseling and BRCA1 and BRCA2 testing. Cervical Cancer Routine pelvic examinations to screen for cervical cancer are no longer recommended for nonpregnant women who are considered low risk for cancer of the pelvic organs (ovaries, uterus, and vagina) and who do not have symptoms. A pelvic examination may be necessary if you have symptoms including those associated with pelvic infections. Ask your health care Tatayana Beshears if a screening pelvic exam is right for you.   The Pap test is the screening test for cervical cancer for women who are considered at risk.  If you had a hysterectomy for a problem that was not cancer or a condition that could lead to cancer, then you no longer need Pap tests.  If you are older than 65 years, and you have had normal Pap tests for the past 10 years, you no longer need to have Pap tests.  If you have had past treatment for cervical cancer or a condition that could lead to cancer, you need Pap tests and screening for cancer for at least 20 years after your treatment.  If you no longer get a Pap test, assess your risk factors if they change (such as having a new sexual partner). This can affect whether you should start being screened again.  Some women have medical problems that increase their chance of getting cervical cancer. If this is the case for you, your health care Tyner Codner may recommend more frequent screening and Pap tests.  The human papillomavirus (HPV) test is another test that may be used for cervical cancer screening. The HPV test looks for the virus that can cause cell changes in the cervix. The cells  collected during the Pap test can be tested for HPV.  The HPV test can be used to screen women 72 years of age and older. Getting tested for HPV can extend the interval between normal Pap tests from three to five years.  An HPV test also should be used to screen women of any age who have unclear Pap test results.  After 52 years of age, women should have HPV testing as often as Pap tests.  Colorectal Cancer  This type of cancer can be detected and often prevented.  Routine colorectal cancer screening usually begins at 53 years of age and continues through 52 years of age.  Your health care Malikhi Ogan may recommend screening at an earlier age if you have risk factors for colon cancer.  Your health care Mckenzee Beem may also recommend using home test kits to check for hidden blood in the stool.  A small camera  at the end of a tube can be used to examine your colon directly (sigmoidoscopy or colonoscopy). This is done to check for the earliest forms of colorectal cancer.  Routine screening usually begins at age 40.  Direct examination of the colon should be repeated every 5-10 years through 52 years of age. However, you may need to be screened more often if early forms of precancerous polyps or small growths are found. Skin Cancer  Check your skin from head to toe regularly.  Tell your health care Marlee Trentman about any new moles or changes in moles, especially if there is a change in a mole's shape or color.  Also tell your health care Stuti Sandin if you have a mole that is larger than the size of a pencil eraser.  Always use sunscreen. Apply sunscreen liberally and repeatedly throughout the day.  Protect yourself by wearing long sleeves, pants, a wide-brimmed hat, and sunglasses whenever you are outside. HEART DISEASE, DIABETES, AND HIGH BLOOD PRESSURE   Have your blood pressure checked at least every 1-2 years. High blood pressure causes heart disease and increases the risk of stroke.  If  you are between 74 years and 72 years old, ask your health care Byran Bilotti if you should take aspirin to prevent strokes.  Have regular diabetes screenings. This involves taking a blood sample to check your fasting blood sugar level.  If you are at a normal weight and have a low risk for diabetes, have this test once every three years after 52 years of age.  If you are overweight and have a high risk for diabetes, consider being tested at a younger age or more often. PREVENTING INFECTION  Hepatitis B  If you have a higher risk for hepatitis B, you should be screened for this virus. You are considered at high risk for hepatitis B if:  You were born in a country where hepatitis B is common. Ask your health care Julene Rahn which countries are considered high risk.  Your parents were born in a high-risk country, and you have not been immunized against hepatitis B (hepatitis B vaccine).  You have HIV or AIDS.  You use needles to inject street drugs.  You live with someone who has hepatitis B.  You have had sex with someone who has hepatitis B.  You get hemodialysis treatment.  You take certain medicines for conditions, including cancer, organ transplantation, and autoimmune conditions. Hepatitis C  Blood testing is recommended for:  Everyone born from [birth year] through 1963.  Anyone with known risk factors for hepatitis C. Sexually transmitted infections (STIs)  You should be screened for sexually transmitted infections (STIs) including gonorrhea and chlamydia if:  You are sexually active and are younger than 52 years of age.  You are older than 52 years of age and your health care Caidence Higashi tells you that you are at risk for this type of infection.  Your sexual activity has changed since you were last screened and you are at an increased risk for chlamydia or gonorrhea. Ask your health care Shundra Wirsing if you are at risk.  If you do not have HIV, but are at risk, it may be recommended that  you take a prescription medicine daily to prevent HIV infection. This is called pre-exposure prophylaxis (PrEP). You are considered at risk if:  You are sexually active and do not regularly use condoms or know the HIV status of your partner(s).  You take drugs by injection.  You are sexually active with a partner  who has HIV. Talk with your health care Aveah Castell about whether you are at high risk of being infected with HIV. If you choose to begin PrEP, you should first be tested for HIV. You should then be tested every 3 months for as long as you are taking PrEP.  PREGNANCY   If you are premenopausal and you may become pregnant, ask your health care Reynold Mantell about preconception counseling.  If you may become pregnant, take 400 to 800 micrograms (mcg) of folic acid every day.  If you want to prevent pregnancy, talk to your health care Aroura Vasudevan about birth control (contraception). OSTEOPOROSIS AND MENOPAUSE   Osteoporosis is a disease in which the bones lose minerals and strength with aging. This can result in serious bone fractures. Your risk for osteoporosis can be identified using a bone density scan.  If you are 8 years of age or older, or if you are at risk for osteoporosis and fractures, ask your health care Aldric Wenzler if you should be screened.  Ask your health care Treon Kehl whether you should take a calcium or vitamin D supplement to lower your risk for osteoporosis.  Menopause may have certain physical symptoms and risks.  Hormone replacement therapy may reduce some of these symptoms and risks. Talk to your health care Teniyah Seivert about whether hormone replacement therapy is right for you.  HOME CARE INSTRUCTIONS   Schedule regular health, dental, and eye exams.  Stay current with your immunizations.   Do not use any tobacco products including cigarettes, chewing tobacco, or electronic cigarettes.  If you are pregnant, do not drink alcohol.  If you are breastfeeding, limit how  much and how often you drink alcohol.  Limit alcohol intake to no more than 1 drink per day for nonpregnant women. One drink equals 12 ounces of beer, 5 ounces of wine, or 1 ounces of hard liquor.  Do not use street drugs.  Do not share needles.  Ask your health care Macarthur Lorusso for help if you need support or information about quitting drugs.  Tell your health care Reford Olliff if you often feel depressed.  Tell your health care Sherryll Skoczylas if you have ever been abused or do not feel safe at home. Document Released: 01/21/2011 Document Revised: 11/22/2013 Document Reviewed: 06/09/2013 [facility] Patient Information 2015 [place], [place]. This information is not intended to replace advice given to you by your health care Belina Mandile. Make sure you discuss any questions you have with your health care Kodi Steil.

## 2015-04-08 LAB — URINALYSIS W MICROSCOPIC + REFLEX CULTURE
Bilirubin Urine: NEGATIVE
CASTS: NONE SEEN [LPF]
CRYSTALS: NONE SEEN [HPF]
Glucose, UA: NEGATIVE
HGB URINE DIPSTICK: NEGATIVE
KETONES UR: NEGATIVE
Nitrite: NEGATIVE
Protein, ur: NEGATIVE
RBC / HPF: NONE SEEN RBC/HPF (ref <=2)
Specific Gravity, Urine: 1.031 (ref 1.001–1.035)
Yeast: NONE SEEN [HPF]
pH: 5.5 (ref 5.0–8.0)

## 2015-04-09 LAB — URINE CULTURE: Colony Count: 5000

## 2015-05-04 ENCOUNTER — Telehealth: Payer: Self-pay | Admitting: *Deleted

## 2015-05-04 NOTE — Telephone Encounter (Signed)
Pt called requesting [facility] to [facility] to be seen for colonoscopy I left on pt voicemail [phone].

## 2017-07-22 HISTORY — PX: IUD REMOVAL: SHX5392

## 2017-07-25 ENCOUNTER — Ambulatory Visit (INDEPENDENT_AMBULATORY_CARE_PROVIDER_SITE_OTHER): Payer: 59 | Admitting: Gynecology

## 2017-07-25 ENCOUNTER — Encounter: Payer: Self-pay | Admitting: Gynecology

## 2017-07-25 VITALS — BP 144/84 | Ht 65.0 in | Wt 113.0 lb

## 2017-07-25 DIAGNOSIS — Z01419 Encounter for gynecological examination (general) (routine) without abnormal findings: Secondary | ICD-10-CM | POA: Diagnosis not present

## 2017-07-25 DIAGNOSIS — Z30432 Encounter for removal of intrauterine contraceptive device: Secondary | ICD-10-CM

## 2017-07-25 NOTE — Progress Notes (Signed)
    Amy Fisher 07-30-1960 [id number]        56 y.o.  G0P0 for annual gynecologic exam.  Mirena IUD due to be replaced/removed now at 5-year interval.  Patient having sporadic menses.  No menopausal symptoms such as hot flushes, night sweats or vaginal dryness.  Past medical history,surgical history, problem list, medications, allergies, family history and social history were all reviewed and documented as reviewed in the EPIC chart.  ROS:  Performed with pertinent positives and negatives included in the history, assessment and plan.   Additional significant findings : None   Exam: Caryn Bee assistant Vitals:   07/25/17 0829  BP: (!) 144/84  Weight: 113 lb (51.3 kg)  Height: 5\' 5"  (1.651 m)   Body mass index is 18.8 kg/m.  General appearance:  Normal affect, orientation and appearance. Skin: Grossly normal HEENT: Without gross lesions.  No cervical or supraclavicular adenopathy. Thyroid normal.  Lungs:  Clear without wheezing, rales or rhonchi Cardiac: RR, without RMG Abdominal:  Soft, nontender, without masses, guarding, rebound, organomegaly or hernia Breasts:  Examined lying and sitting without masses, retractions, discharge or axillary adenopathy. Pelvic:  Ext, BUS, Vagina: Normal  Cervix: Normal.  IUD string visualized  Uterus: Anteverted, normal size, shape and contour, midline and mobile nontender   Adnexa: Without masses or tenderness    Anus and perineum: Normal   Rectovaginal: Normal sphincter tone without palpated masses or tenderness.   Procedure: Her Mirena IUD string was visualized with the speculum, grasped with a [place] forcep and her Mirena IUD was removed, shown to the patient and discarded.  Assessment/Plan:  57 y.o. G0P0 female for annual gynecologic exam sporadic menses, Mirena IUD.   1. Mirena IUD.  Patient wants to have removed which was accomplished as above.  Discussed the perimenopausal timeframe with the patient and what to expect after  removing the IUD.  She will keep a menstrual calendar and as long as regular or less frequent but regular menses when they occur then will follow.  If prolonged or atypical bleeding or develops significant menopausal symptoms she will represent for further evaluation.  We discussed the need for contraception and she states this is not an issue recognizing the possibility of pregnancy as long as she is ovulating. 2. Pap smear/HPV 06/2013.  No Pap smear done today.  History of LGSIL 2008, ASCUS 2009 and normal Pap smears since.  Plan repeat Pap smear next year at 5-year interval per current screening guidelines. 3. Mammography 2016.  Reminded patient she is overdue and patient agrees to call and schedule.  Breast exam normal today.  SBE monthly reviewed. 4. Colonoscopy 2016.  Repeat at their recommended interval. 5. History of leiomyoma.  5 cm leiomyoma on prior ultrasound.  Pelvic exam shows uterus to be normal size.  Continue with annual monitoring with physical exam. 6. Health maintenance.  Blood pressure 144/84.  Recently diagnosed and started to be treated for hypertension.  She will continue to monitor blood pressure and follow-up with her primary physician in reference to this.  No routine blood work drawn as patient reports is done elsewhere.  Follow-up in 1 year, sooner if any issues.   [name], 9:13 AM 07/25/2017

## 2017-07-25 NOTE — Patient Instructions (Signed)
Schedule our mammogram  Follow up with your primary MD in reference to your blood pressure  Follow up for annual exam in one year

## 2019-04-20 ENCOUNTER — Encounter: Payer: Self-pay | Admitting: Gynecology

## 2019-06-22 HISTORY — PX: BREAST BIOPSY: SHX20

## 2019-06-30 ENCOUNTER — Other Ambulatory Visit (HOSPITAL_BASED_OUTPATIENT_CLINIC_OR_DEPARTMENT_OTHER): Payer: Self-pay | Admitting: Family Medicine

## 2019-06-30 ENCOUNTER — Other Ambulatory Visit (HOSPITAL_BASED_OUTPATIENT_CLINIC_OR_DEPARTMENT_OTHER): Payer: Self-pay | Admitting: Gynecology

## 2019-06-30 DIAGNOSIS — Z1231 Encounter for screening mammogram for malignant neoplasm of breast: Secondary | ICD-10-CM

## 2019-07-07 ENCOUNTER — Ambulatory Visit (HOSPITAL_BASED_OUTPATIENT_CLINIC_OR_DEPARTMENT_OTHER)
Admission: RE | Admit: 2019-07-07 | Discharge: 2019-07-07 | Disposition: A | Discharge status: Home / Self Care | Payer: 59 | Source: Ambulatory Visit | Attending: Gynecology | Admitting: Gynecology

## 2019-07-07 ENCOUNTER — Other Ambulatory Visit: Payer: Self-pay

## 2019-07-07 DIAGNOSIS — Z1231 Encounter for screening mammogram for malignant neoplasm of breast: Secondary | ICD-10-CM

## 2019-07-08 ENCOUNTER — Other Ambulatory Visit: Payer: Self-pay | Admitting: Gynecology

## 2019-07-08 DIAGNOSIS — N6459 Other signs and symptoms in breast: Secondary | ICD-10-CM

## 2019-07-08 DIAGNOSIS — R928 Other abnormal and inconclusive findings on diagnostic imaging of breast: Secondary | ICD-10-CM

## 2019-07-14 ENCOUNTER — Other Ambulatory Visit: Payer: Self-pay

## 2019-07-14 ENCOUNTER — Ambulatory Visit
Admission: RE | Admit: 2019-07-14 | Discharge: 2019-07-14 | Disposition: A | Discharge status: Home / Self Care | Payer: 59 | Source: Ambulatory Visit | Attending: Gynecology | Admitting: Gynecology

## 2019-07-14 ENCOUNTER — Other Ambulatory Visit: Payer: Self-pay | Admitting: Gynecology

## 2019-07-14 DIAGNOSIS — R928 Other abnormal and inconclusive findings on diagnostic imaging of breast: Secondary | ICD-10-CM

## 2019-07-14 DIAGNOSIS — N6459 Other signs and symptoms in breast: Secondary | ICD-10-CM

## 2019-07-22 ENCOUNTER — Ambulatory Visit
Admission: RE | Admit: 2019-07-22 | Discharge: 2019-07-22 | Disposition: A | Discharge status: Home / Self Care | Payer: 59 | Source: Ambulatory Visit | Attending: Gynecology | Admitting: Gynecology

## 2019-07-22 ENCOUNTER — Other Ambulatory Visit: Payer: Self-pay

## 2019-07-22 DIAGNOSIS — R928 Other abnormal and inconclusive findings on diagnostic imaging of breast: Secondary | ICD-10-CM

## 2019-07-30 ENCOUNTER — Other Ambulatory Visit: Payer: Self-pay | Admitting: Otolaryngology

## 2019-07-30 ENCOUNTER — Telehealth: Payer: Self-pay | Admitting: Oncology

## 2019-07-30 ENCOUNTER — Other Ambulatory Visit (HOSPITAL_COMMUNITY): Payer: Self-pay | Admitting: Otolaryngology

## 2019-07-30 DIAGNOSIS — C50912 Malignant neoplasm of unspecified site of left female breast: Secondary | ICD-10-CM

## 2019-07-30 DIAGNOSIS — Z170 Estrogen receptor positive status [ER+]: Secondary | ICD-10-CM

## 2019-07-30 NOTE — Telephone Encounter (Signed)
Amy Fisher has been cld and scheduled for genetics appt w/Emily Stiglich on [phone] at 2pm and to see Dr. Jana Hakim on the same day at 4pm w/labs at 330pm. She's been made aware to arrive 15 minutes early.

## 2019-08-04 ENCOUNTER — Encounter: Payer: Self-pay | Admitting: Adult Health

## 2019-08-04 DIAGNOSIS — C50512 Malignant neoplasm of lower-outer quadrant of left female breast: Secondary | ICD-10-CM | POA: Insufficient documentation

## 2019-08-04 DIAGNOSIS — Z170 Estrogen receptor positive status [ER+]: Secondary | ICD-10-CM | POA: Insufficient documentation

## 2019-08-04 NOTE — Progress Notes (Signed)
[place]  [phone] [phone]     ID: Amy Fisher DOB: 01-01-1961  MR#: [id number]  GNO#:[id number]  Patient Care Team: Orpah Melter, MD as PCP - General (Family Medicine) Mauro Kaufmann, RN as Oncology Nurse Navigator [name], RN as Oncology Nurse Navigator Alphonsa Overall, MD as Consulting Physician (General Surgery) Mac Dowdell, Virgie Dad, MD as Consulting Physician (Oncology) Gery Pray, MD as Consulting Physician (Radiation Oncology) Chauncey Cruel, MD OTHER MD:  CHIEF COMPLAINT: Estrogen receptor positive lobular breast cancer  CURRENT TREATMENT: Awaiting definitive surgery   HISTORY OF CURRENT ILLNESS: Amy Fisher had routine screening mammography on 07/07/2019 showing a possible abnormality in the left breast. She underwent left diagnostic mammography with tomography and left breast ultrasonography at [facility] on 07/14/2019 showing: breast density category C; irregular 1.5 cm mass at 4 o'clock in the left breast with a 0.4 cm adjacent satellite nodule; no left axillary adenopathy. Physical exam showed a 5 mm blue domed cyst in the left breast a 4 o'clock, palpable as a BB size superficial mass and a discrete palpable 2 cm mass approximately 2 cm below the previous.  Accordingly on 07/22/2019 she proceeded to biopsy of the left breast area in question. The pathology from this procedure ([id number]) showed: invasive mammary carcinoma, grade 2, e-cadherin negative. Prognostic indicators significant for: estrogen receptor, 100% positive and progesterone receptor, 95% positive, both with strong staining intensity. Proliferation marker Ki67 at 2%. HER2 equivocal by immunohistochemistry (2+), but negative by fluorescent in situ hybridization with a signals ratio 1.05 and number per cell 2.00.  The patient's subsequent history is as detailed below.   INTERVAL HISTORY: Amy Fisher was evaluated in the breast cancer  clinic on 08/05/2019. Her case was also presented at the multidisciplinary breast cancer conference on 08/04/2019. At that time a preliminary plan was proposed: Obtain an MRI prior to definitive surgery; adjuvant radiation, antiestrogens genetics testing, likely no chemotherapy but Oncotype will be sent from the definitive surgical sample   REVIEW OF SYSTEMS: There were no specific symptoms leading to the original mammogram, which was routinely scheduled. The patient denies unusual headaches, visual changes, nausea, vomiting, stiff neck, dizziness, or gait imbalance. There has been no cough, phlegm production, or pleurisy, no chest pain or pressure, and no change in bowel or bladder habits. The patient denies fever, rash, bleeding, unexplained fatigue or unexplained weight loss.  She exercises chiefly by running but also does weights and is generally very active.  A detailed review of systems was otherwise entirely negative.   PAST MEDICAL HISTORY: Past Medical History:  Diagnosis Date  . Cervical dysplasia    lgsil 2008, ascus 2009, normal Pap smears afterwards  . Family history of breast cancer   . Fibroid   . Hypertension   Exercise-induced asthma, likely bilateral early carpal tunnel  PAST SURGICAL HISTORY: Past Surgical History:  Procedure Laterality Date  . IUD REMOVAL  07/2017   Mirena  . PILONIDAL CYST EXCISION      FAMILY HISTORY: Family History  Problem Relation Age of Onset  . Cancer Maternal Grandmother        ? type  . Breast cancer Mother 45  . Celiac disease Sister   . Alzheimer's disease Father   . Cancer Paternal Aunt        ? type  . Alzheimer's disease Paternal Grandfather   . Breast cancer Other        dx70s   Patient has 1 sister,  83, and 1 brother, 44, with no history of cancer. No cancers in her 3 nephews.  Amy Fisher mother was diagnosed with breast cancer at 85 and is living at 41. Patient has 1 maternal uncle, no cancers. No known cancers for her 2  female cousins. Patient's maternal grandmother did have cancer but is unsure the type. Maternal grandmother's sister did have breast cancer in her 41s. Maternal grandfather died in his 57s due to heart issues.  Amy Fisher father died at 74 due to Alzheimer's. Patient had 1 paternal aunt, she had cancer but unsure type. No cancers for her 3 paternal female cousins. Paternal grandparents both died in their 65s.  Amy Fisher is unaware of previous family history of genetic testing for hereditary cancer risks. Patient's maternal ancestors are of Caucasian descent, and paternal ancestors are of Caucasian descent. There is no reported Ashkenazi Jewish ancestry. There is no known consanguinity.  GYNECOLOGIC HISTORY:  Patient's last menstrual period was 03/31/2015. Menarche: 60 years old [place] P 1 LMP 2019 Contraceptive: history of Mirena IUD (removed 07/2017) HRT no Hysterectomy? no BSO? 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 She is divorced and lives by herself.  She had a son at age 9 whom she gave up for adoption but she has since reconstructed.  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  The patient has 1 grandchild.  She attends a local [place] DIRECTIVES: To be discussed  HEALTH MAINTENANCE: Social History   Tobacco Use  . Smoking status: Never Smoker  . Smokeless tobacco: Never Used  Substance Use Topics  . Alcohol use: Yes    Alcohol/week: 0.0 standard drinks    Comment: OCC, WINE  . Drug use: No     Colonoscopy: 2016  PAP: 06/2013, negative (repeat due 06/2018)  Bone density:    No Known Allergies  Current Outpatient Medications  Medication Sig Dispense Refill  . acetaminophen (TYLENOL) 500 MG tablet Take by mouth.    Marland Kitchen albuterol (VENTOLIN HFA) 108 (90 Base) MCG/ACT inhaler     . benazepril  (LOTENSIN) 10 MG tablet Take 10 mg by mouth daily.    . hydrochlorothiazide (HYDRODIURIL) 12.5 MG tablet Take 12.5 mg by mouth daily.    Marland Kitchen ibuprofen (ADVIL) 200 MG tablet Take by mouth.    . levonorgestrel (MIRENA) 20 MCG/24HR IUD 1 each by Intrauterine route once.    . Multiple Vitamin (MULTIVITAMIN) capsule Take by mouth.    . Multiple Vitamin (MULTIVITAMIN) tablet Take 1 tablet by mouth daily.     No current facility-administered medications for this visit.    OBJECTIVE: Middle-aged white woman who appears younger than stated age  23:   08/05/19 1446  BP: (!) 142/67  Pulse: 96  Resp: 18  Temp: 97.8 F (36.6 C)  SpO2: 100%     Body mass index is 20.57 kg/m.   Wt Readings from Last 3 Encounters:  08/05/19 123 lb 9.6 oz (56.1 kg)  07/25/17 113 lb (51.3 kg)  04/07/15 132 lb (59.9 kg)      ECOG FS:0 - Asymptomatic  Ocular: Sclerae unicteric, pupils round and equal Ear-nose-throat: Wearing a mask Lymphatic: No cervical or supraclavicular adenopathy Lungs no rales or rhonchi Heart regular rate and rhythm Abd soft, nontender, positive bowel sounds MSK no focal spinal tenderness, no joint edema Neuro: non-focal,  well-oriented, appropriate affect Breasts: The right breast is unremarkable.  The left breast is status post recent biopsy.  There is some induration associated with the small ecchymosis.  There are no palpable masses in either axilla   LAB RESULTS:  CMP     Component Value Date/Time   NA 138 06/03/2012 0940   K 4.5 06/03/2012 0940   CL 105 06/03/2012 0940   CO2 25 06/03/2012 0940   GLUCOSE 85 06/03/2012 0940   BUN 13 06/03/2012 0940   CREATININE 0.69 06/03/2012 0940   CALCIUM 9.6 06/03/2012 0940   PROT 6.8 06/03/2012 0940   ALBUMIN 4.6 06/03/2012 0940   AST 15 06/03/2012 0940   ALT 13 06/03/2012 0940   ALKPHOS 38 (L) 06/03/2012 0940   BILITOT 0.5 06/03/2012 0940    No results found for: TOTALPROTELP, ALBUMINELP, A1GS, A2GS, BETS, BETA2SER, GAMS,  MSPIKE, SPEI  Lab Results  Component Value Date   WBC 5.3 06/03/2012   NEUTROABS 3.2 06/03/2012   HGB 15.3 (H) 06/03/2012   HCT 44.5 06/03/2012   MCV 87.1 06/03/2012   PLT 192 06/03/2012    No results found for: LABCA2  No components found for: FGHWEX937  No results for input(s): INR in the last 168 hours.  No results found for: LABCA2  No results found for: JIR678  No results found for: LFY101  No results found for: BPZ025  No results found for: CA2729  No components found for: HGQUANT  No results found for: CEA1 / No results found for: CEA1   No results found for: AFPTUMOR  No results found for: CHROMOGRNA  No results found for: KPAFRELGTCHN, LAMBDASER, KAPLAMBRATIO (kappa/lambda light chains)  No results found for: HGBA, HGBA2QUANT, HGBFQUANT, HGBSQUAN (Hemoglobinopathy evaluation)   No results found for: LDH  No results found for: IRON, TIBC, IRONPCTSAT (Iron and TIBC)  No results found for: FERRITIN  Urinalysis    Component Value Date/Time   COLORURINE DARK YELLOW 04/07/2015 0850   APPEARANCEUR TURBID (A) 04/07/2015 0850   LABSPEC 1.031 04/07/2015 0850   PHURINE 5.5 04/07/2015 0850   GLUCOSEU NEGATIVE 04/07/2015 0850   HGBUR NEGATIVE 04/07/2015 0850   BILIRUBINUR NEGATIVE 04/07/2015 0850   KETONESUR NEGATIVE 04/07/2015 0850   PROTEINUR NEGATIVE 04/07/2015 0850   UROBILINOGEN 0.2 06/03/2012 0940   NITRITE NEGATIVE 04/07/2015 0850   LEUKOCYTESUR TRACE (A) 04/07/2015 0850     STUDIES: US BREAST LTD UNI LEFT INC AXILLA  Result Date: 07/14/2019 CLINICAL DATA:  The patient returns after screening study for evaluation of possible LEFT breast mass associated with distortion. EXAM: DIGITAL DIAGNOSTIC LEFT MAMMOGRAM WITH CAD AND TOMO ULTRASOUND LEFT BREAST COMPARISON:  07/07/2019 and earlier ACR Breast Density Category c: The breast tissue is heterogeneously dense, which may obscure small masses. FINDINGS: Additional 2-D and 3-D images are performed.  These views confirm presence of distortion in the LOWER OUTER QUADRANT of the LEFT breast and further evaluated with ultrasound. Additionally, there is a superficial smaller oval mass measuring 4 millimeters. This mass has decreased in size since evaluated in 2012. Ultrasound and diagnostic mammogram performed at that time showed a superficial cyst measuring 0.8 x 0.7 x 0.6 centimeters. This cyst was visible as a bluish superficial mass. Mammographic images were processed with CAD. On physical exam, there is a 5 millimeter blue domed cyst in the 4 o'clock location of the LEFT breast, palpable as a BB size superficial mass. Approximately 2 centimeters below this lesion there is a discrete palpable 2 centimeter mass. Targeted ultrasound  is performed, showing an irregular mass with irregular margins in the 4 o'clock location of the LEFT breast 2 centimeters from the nipple. Mass measures 1.5 x 1.1 x 1.1 centimeters. An adjacent small satellite nodule is 0.4 x 0.3 x 0.3 centimeters. The smaller mass is 3 millimeters from the larger mass. Measured together, these are 2.0 centimeters in diameter. Doppler evaluation demonstrates significant internal blood flow. Evaluation of the LEFT axilla is negative for adenopathy. IMPRESSION: 1. Irregular mass in the 4 o'clock location of the LEFT breast for which biopsy is recommended. This lesion has an adjacent 4 millimeter satellite nodule, likely with similar pathology. I would recommend biopsy of these adjacent lesions as a single lesion. 2. No LEFT axillary adenopathy. RECOMMENDATION: Ultrasound-guided core biopsy of LEFT breast mass 4 o'clock location. I have discussed the findings and recommendations with the patient. If applicable, a reminder letter will be sent to the patient regarding the next appointment. BI-RADS CATEGORY  4: Suspicious. Electronically Signed   By: Nolon Nations M.D.   On: 07/14/2019 14:00   MM DIAG BREAST TOMO UNI LEFT  Result Date:  07/14/2019 CLINICAL DATA:  The patient returns after screening study for evaluation of possible LEFT breast mass associated with distortion. EXAM: DIGITAL DIAGNOSTIC LEFT MAMMOGRAM WITH CAD AND TOMO ULTRASOUND LEFT BREAST COMPARISON:  07/07/2019 and earlier ACR Breast Density Category c: The breast tissue is heterogeneously dense, which may obscure small masses. FINDINGS: Additional 2-D and 3-D images are performed. These views confirm presence of distortion in the LOWER OUTER QUADRANT of the LEFT breast and further evaluated with ultrasound. Additionally, there is a superficial smaller oval mass measuring 4 millimeters. This mass has decreased in size since evaluated in 2012. Ultrasound and diagnostic mammogram performed at that time showed a superficial cyst measuring 0.8 x 0.7 x 0.6 centimeters. This cyst was visible as a bluish superficial mass. Mammographic images were processed with CAD. On physical exam, there is a 5 millimeter blue domed cyst in the 4 o'clock location of the LEFT breast, palpable as a BB size superficial mass. Approximately 2 centimeters below this lesion there is a discrete palpable 2 centimeter mass. Targeted ultrasound is performed, showing an irregular mass with irregular margins in the 4 o'clock location of the LEFT breast 2 centimeters from the nipple. Mass measures 1.5 x 1.1 x 1.1 centimeters. An adjacent small satellite nodule is 0.4 x 0.3 x 0.3 centimeters. The smaller mass is 3 millimeters from the larger mass. Measured together, these are 2.0 centimeters in diameter. Doppler evaluation demonstrates significant internal blood flow. Evaluation of the LEFT axilla is negative for adenopathy. IMPRESSION: 1. Irregular mass in the 4 o'clock location of the LEFT breast for which biopsy is recommended. This lesion has an adjacent 4 millimeter satellite nodule, likely with similar pathology. I would recommend biopsy of these adjacent lesions as a single lesion. 2. No LEFT axillary  adenopathy. RECOMMENDATION: Ultrasound-guided core biopsy of LEFT breast mass 4 o'clock location. I have discussed the findings and recommendations with the patient. If applicable, a reminder letter will be sent to the patient regarding the next appointment. BI-RADS CATEGORY  4: Suspicious. Electronically Signed   By: Nolon Nations M.D.   On: 07/14/2019 14:00   MM 3D SCREEN BREAST BILATERAL  Result Date: 07/07/2019 CLINICAL DATA:  Screening. EXAM: DIGITAL SCREENING BILATERAL MAMMOGRAM WITH TOMO AND CAD COMPARISON:  Previous exam(s). ACR Breast Density Category c: The breast tissue is heterogeneously dense, which may obscure small masses. FINDINGS: In the left  breast, a possible mass with distortion warrants further evaluation. In the right breast, no findings suspicious for malignancy. Images were processed with CAD. IMPRESSION: Further evaluation is suggested for possible mass with distortion in the left breast. RECOMMENDATION: Diagnostic mammogram and possibly ultrasound of the left breast. (Code:[id number]) The patient will be contacted regarding the findings, and additional imaging will be scheduled. BI-RADS CATEGORY  0: Incomplete. Need additional imaging evaluation and/or prior mammograms for comparison. Electronically Signed   By: Lajean Manes M.D.   On: 07/07/2019 13:27   MM CLIP PLACEMENT LEFT  Result Date: 07/22/2019 CLINICAL DATA:  Ultrasound-guided core needle biopsy was performed of a mass in the 4 o'clock position of the left breast. EXAM: DIAGNOSTIC LEFT MAMMOGRAM POST ULTRASOUND BIOPSY COMPARISON:  Previous exam(s). FINDINGS: Mammographic images were obtained following ultrasound guided biopsy of the left breast. The biopsy marking clip is in expected position at the site of biopsied mass in the lower outer quadrant. IMPRESSION: Appropriate positioning of the ribbon shaped biopsy marking clip at the site of biopsy in the 4 o'clock location. Final Assessment: Post Procedure Mammograms for  Marker Placement Electronically Signed   By: Curlene Dolphin M.D.   On: 07/22/2019 13:35   [place] LT BREAST BX W LOC DEV 1ST LESION IMG BX SPEC US GUIDE  Addendum Date: 07/27/2019   ADDENDUM REPORT: 07/27/2019 08:17 ADDENDUM: Pathology revealed GRADE II INVASIVE MAMMARY CARCINOMA of the LEFT breast, 4 o'clock position. This was found to be concordant by Dr. Curlene Dolphin. Pathology results were discussed with the patient by telephone. The patient reported doing well after the biopsy with tenderness at the site. Post biopsy instructions and care were reviewed and questions were answered. The patient was encouraged to call [facility] for any additional concerns. At the request of the patient, surgical consultation has been arranged with Dr. Alphonsa Overall at [facility] on July 29, 2019. Pathology results reported by Stacie Acres, RN on 07/27/2019. Electronically Signed   By: Curlene Dolphin M.D.   On: 07/27/2019 08:17   Result Date: 07/27/2019 CLINICAL DATA:  59 year old patient presents for ultrasound-guided biopsy of a left breast mass at 4 o'clock position. EXAM: ULTRASOUND GUIDED LEFT BREAST CORE NEEDLE BIOPSY COMPARISON:  Previous exam(s). FINDINGS: I met with the patient and we discussed the procedure of ultrasound-guided biopsy, including benefits and alternatives. We discussed the high likelihood of a successful procedure. We discussed the risks of the procedure, including infection, bleeding, tissue injury, clip migration, and inadequate sampling. Informed written consent was given. The usual time-out protocol was performed immediately prior to the procedure. Lesion quadrant: Lower outer quadrant Using sterile technique and 1% Lidocaine with and without epinephrine as local anesthetic, under direct ultrasound visualization, a 14 gauge spring-loaded device was used to perform biopsy of an irregular using a medial approach. At the conclusion of the procedure ribbon tissue  marker clip was deployed into the biopsy cavity. Follow up 2 view mammogram was performed and dictated separately. IMPRESSION: Ultrasound guided biopsy of the right breast. No apparent complications. Electronically Signed: By: Curlene Dolphin M.D. On: 07/22/2019 13:24     ELIGIBLE FOR AVAILABLE RESEARCH PROTOCOL: No  ASSESSMENT: 59 y.o. [place], [place] woman status post left breast lower outer quadrant biopsy 07/22/2019 for a clinical T1c N0, stage IA invasive lobular carcinoma, E-cadherin negative, estrogen and progesterone receptor positive, HER-2 not amplified, with an MIB-1 of 2%  (1) genetics testing 08/04/2018   (2) definitive surgery pending  (3) Oncotype  to be obtained from the definitive surgical sample: Chemotherapy not anticipated  (4) adjuvant radiation to follow as appropriate  (5) antiestrogens to start of the completion of local treatment  PLAN: I met with Aliegha today to discuss her new diagnosis Specifically we reviewed the biology of her breast cancer, its staging, and and treatment options. We first reviewed the fact that cancer is not one disease but more than 100 different diseases and that it is important to keep them separate-- otherwise when friends and relatives discuss their own cancer experiences with [facility] confusion can result. Similarly we explained that if breast cancer spreads to the bone or liver, the patient would not have bone cancer or liver cancer, but breast cancer in the bone and breast cancer in the liver: one cancer in three places-- not 3 different cancers which otherwise would have to be treated in 3 different ways.  We discussed the difference between local and systemic therapy. In terms of loco-regional treatment, lumpectomy plus radiation is equivalent to mastectomy as far as survival is concerned. For this reason, and because the cosmetic results are generally superior, we recommend breast conserving surgery.   We then discussed the rationale for  systemic therapy. There is some risk that this cancer may have already spread to other parts of her body. Patients frequently ask at this point about bone scans, CAT scans and PET scans to find out if they have occult breast cancer somewhere else. The problem is that in early stage disease we are much more likely to find false positives then true cancers and this would expose the patient to unnecessary procedures as well as unnecessary radiation. Scans cannot answer the question the patient really would like to know, which is whether she has microscopic disease elsewhere in her body. For those reasons we do not recommend them.  Of course we would proceed to aggressive evaluation of any symptoms that might suggest metastatic disease, but that is not the case here.  Next we went over the options for systemic therapy which are anti-estrogens, anti-HER-2 immunotherapy, and chemotherapy. Cieanna does not meet criteria for anti-HER-2 immunotherapy. She is a good candidate for anti-estrogens.  The question of chemotherapy is more complicated. Chemotherapy is most effective in rapidly growing, aggressive tumors. It is much less effective in not high-grade, slow growing cancers, like Heli 's. For that reason we are going to request an Oncotype from the definitive surgical sample, as suggested by NCCN guidelines.  She understands I anticipate the results will tell us that she will have a very good prognosis without chemotherapy and that chemotherapy would not make the prognosis any better.  The plan then is to proceed with surgery.  She does not have very large breasts and it will be helpful to obtain an MRI to see if we can clarify issues of cosmesis before surgery.  Hopefully she will be able to keep her breast even if later on she may need some uncal plasty.  At this point that would be her preference although of course if she happens to carry a high risk germline mutation she would want to reconsider that.     We are checking an Oncotype but I do not anticipate her needing chemotherapy.  Accordingly after surgery she will undergo radiation and then antiestrogens.  I will make an appointment for her to see me in about 2 months to begin to discuss that.  Tiffane has a good understanding of the overall plan. She agrees with it. She  knows the goal of treatment in her case is cure. She will call with any problems that may develop before her next visit here.  Total encounter time 65 minutes.Chauncey Cruel, MD   08/05/2019 4:13 PM Medical Oncology and Hematology [facility] [place], [place] [address] Tel. [phone]    Fax. [phone]   This document serves as a record of services personally performed by Lurline Del, MD. It was created on his behalf by Wilburn Mylar, a trained medical scribe. The creation of this record is based on the scribe's personal observations and the provider's statements to them.   I, Lurline Del MD, have reviewed the above documentation for accuracy and completeness, and I agree with the above.  *Total Encounter Time as defined by the Centers for Medicare and Medicaid Services includes, in addition to the face-to-face time of a patient visit (documented in the note above) non-face-to-face time: obtaining and reviewing outside history, ordering and reviewing medications, tests or procedures, care coordination (communications with other health care professionals or caregivers) and documentation in the medical record.

## 2019-08-05 ENCOUNTER — Encounter: Payer: Self-pay | Admitting: *Deleted

## 2019-08-05 ENCOUNTER — Other Ambulatory Visit: Payer: Self-pay | Admitting: Licensed Clinical Social Worker

## 2019-08-05 ENCOUNTER — Inpatient Hospital Stay: Payer: 59

## 2019-08-05 ENCOUNTER — Other Ambulatory Visit: Payer: Self-pay

## 2019-08-05 ENCOUNTER — Inpatient Hospital Stay (HOSPITAL_BASED_OUTPATIENT_CLINIC_OR_DEPARTMENT_OTHER): Payer: 59 | Admitting: Licensed Clinical Social Worker

## 2019-08-05 ENCOUNTER — Inpatient Hospital Stay: Payer: 59 | Attending: Oncology | Admitting: Oncology

## 2019-08-05 ENCOUNTER — Encounter: Payer: Self-pay | Admitting: Licensed Clinical Social Worker

## 2019-08-05 VITALS — BP 142/67 | HR 96 | Temp 97.8°F | Resp 18 | Ht 65.0 in | Wt 123.6 lb

## 2019-08-05 DIAGNOSIS — C50512 Malignant neoplasm of lower-outer quadrant of left female breast: Secondary | ICD-10-CM

## 2019-08-05 DIAGNOSIS — Z803 Family history of malignant neoplasm of breast: Secondary | ICD-10-CM | POA: Diagnosis not present

## 2019-08-05 DIAGNOSIS — I10 Essential (primary) hypertension: Secondary | ICD-10-CM | POA: Diagnosis not present

## 2019-08-05 DIAGNOSIS — Z170 Estrogen receptor positive status [ER+]: Secondary | ICD-10-CM

## 2019-08-05 DIAGNOSIS — N879 Dysplasia of cervix uteri, unspecified: Secondary | ICD-10-CM | POA: Insufficient documentation

## 2019-08-05 DIAGNOSIS — Z79899 Other long term (current) drug therapy: Secondary | ICD-10-CM | POA: Insufficient documentation

## 2019-08-05 LAB — GENETIC SCREENING ORDER

## 2019-08-05 NOTE — Progress Notes (Signed)
REFERRING PROVIDER: Alphonsa Overall, MD [place],  [place] [address]  PRIMARY PROVIDER:  Orpah Melter, MD  PRIMARY REASON FOR VISIT:  1. Malignant neoplasm of lower-outer quadrant of left breast of female, estrogen receptor positive ([place])   2. Family history of breast cancer      HISTORY OF PRESENT ILLNESS:   Ms. Cuadrado, a 59 y.o. female, was seen for a [place] cancer genetics consultation at the request of Dr. [name] due to a personal and family history of breast cancer.  Ms. Rosenburg presents to clinic today to discuss the possibility of a hereditary predisposition to cancer, genetic testing, and to further clarify her future cancer risks, as well as potential cancer risks for family members.   In 2021, at the age of 59, Ms. Pranger was diagnosed with invasive mammary carcinoma of the left breast, ER/PR+, Her2-. The treatment plan is still being determined, thus far she is planning lumpectomy and has an MRI scheduled.   CANCER HISTORY:  Oncology History  Malignant neoplasm of lower-outer quadrant of left breast of female, estrogen receptor positive ([place])  07/22/2019 Cancer Staging   Staging form: Breast, AJCC 8th Edition - Clinical stage from 07/22/2019: Stage IA (cT1c, cN0, cM0, G2, ER+, PR+, HER2-) - Signed by Gardenia Phlegm, NP on 08/04/2019   08/04/2019 Initial Diagnosis   Malignant neoplasm of lower-outer quadrant of left breast of female, estrogen receptor positive ([place])      RISK FACTORS:  Menarche was at age 40.  First live birth at age 66.  OCP use for approximately 25 years.  Ovaries intact: yes.  Hysterectomy: no.  Menopausal status: postmenopausal.  HRT use: 0 years. Colonoscopy: yes; normal. Mammogram within the last year: yes. Number of breast biopsies: 1. Up to date with pelvic exams: yes. Any excessive radiation exposure in the past: no  Past Medical History:  Diagnosis Date  . Cervical dysplasia    lgsil 2008, ascus  2009, normal Pap smears afterwards  . Family history of breast cancer   . Fibroid   . Hypertension     Past Surgical History:  Procedure Laterality Date  . IUD REMOVAL  07/2017   Mirena  . PILONIDAL CYST EXCISION      Social History   Socioeconomic History  . Marital status: Married    Spouse name: Not on file  . Number of children: Not on file  . Years of education: Not on file  . Highest education level: Not on file  Occupational History  . Not on file  Tobacco Use  . Smoking status: Never Smoker  . Smokeless tobacco: Never Used  Substance and Sexual Activity  . Alcohol use: Yes    Alcohol/week: 0.0 standard drinks    Comment: OCC, WINE  . Drug use: No  . Sexual activity: Yes    Partners: Male    Comment: -1st intercourse 59 yo-Fewer than 5 partners  Other Topics Concern  . Not on file  Social History Narrative  . Not on file   Social Determinants of Health   Financial Resource Strain:   . Difficulty of Paying Living Expenses: Not on file  Food Insecurity:   . Worried About Charity fundraiser in the Last Year: Not on file  . Ran Out of Food in the Last Year: Not on file  Transportation Needs:   . Lack of Transportation (Medical): Not on file  . Lack of Transportation (Non-Medical): Not on file  Physical Activity:   .  Days of Exercise per Week: Not on file  . Minutes of Exercise per Session: Not on file  Stress:   . Feeling of Stress : Not on file  Social Connections:   . Frequency of Communication with Friends and Family: Not on file  . Frequency of Social Gatherings with Friends and Family: Not on file  . Attends Religious Services: Not on file  . Active Member of Clubs or Organizations: Not on file  . Attends Archivist Meetings: Not on file  . Marital Status: Not on file     FAMILY HISTORY:  We obtained a detailed, 4-generation family history.  Significant diagnoses are listed below: Family History  Problem Relation Age of Onset  .  Cancer Maternal Grandmother        ? type  . Breast cancer Mother 73  . Celiac disease Sister   . Alzheimer's disease Father   . Cancer Paternal Aunt        ? type  . Alzheimer's disease Paternal Grandfather   . Breast cancer Other        dx70s   Ms. Dentler has 1 son, he was adopted out but she does have contact with him, no cancer history. Patient has 1 sister, 26, and 1 brother, 58, with no history of cancer. No cancers in her 3 nephews.  Ms. Debenedetto mother was diagnosed with breast cancer at 77 and is living at 60. Patient has 1 maternal uncle, no cancers. No known cancers for her 2 female cousins. Patient's maternal grandmother did have cancer but is unsure the type. Maternal grandmother's sister did have breast cancer in her 51s. Maternal grandfather died in his 69s due to heart issues.  Ms. Hillebrand father died at 54 due to Alzheimer's. Patient had 1 paternal aunt, she had cancer but unsure type. No cancers for her 3 paternal female cousins. Paternal grandparents both died in their 4s.  Ms. Lint is unaware of previous family history of genetic testing for hereditary cancer risks. Patient's maternal ancestors are of Caucasian descent, and paternal ancestors are of Caucasian descent. There is no reported Ashkenazi Jewish ancestry. There is no known consanguinity.  GENETIC COUNSELING ASSESSMENT: Ms. Mcgillivray is a 59 y.o. female with a personal and family history which is somewhat suggestive of a hereditary cancer syndrome and predisposition to cancer. We, therefore, discussed and recommended the following at today's visit.   DISCUSSION: We discussed that 5 - 10% of breast cancer is hereditary, with most cases associated with BRCA1/BRCA2 mutations.  There are other genes that can be associated with hereditary breast cancer syndromes.  We discussed that testing is beneficial for several reasons including surgical decision-making for breast cancer, knowing how to follow individuals after  completing their treatment, and understand if other family members could be at risk for cancer and allow them to undergo genetic testing.   We reviewed the characteristics, features and inheritance patterns of hereditary cancer syndromes. We also discussed genetic testing, including the appropriate family members to test, the process of testing, insurance coverage and turn-around-time for results. We discussed the implications of a negative, positive and/or variant of uncertain significant result. In order to get genetic test results in a timely manner so that Ms. Ruscitti can use these genetic test results for surgical decisions, we recommended Ms. Sargent pursue genetic testing for the [facility] Breast Cancer STAT Panel. Once complete, we recommend Ms. Peale pursue reflex genetic testing to the Common Hereditary Cancers gene panel.   The STAT  Breast cancer panel offered by [facility] includes sequencing and rearrangement analysis for the following 9 genes:  ATM, BRCA1, BRCA2, CDH1, CHEK2, PALB2, PTEN, STK11 and TP53.    The Common Hereditary Cancers Panel offered by [facility] includes sequencing and/or deletion duplication testing of the following 48 genes: APC, ATM, AXIN2, BARD1, BMPR1A, BRCA1, BRCA2, BRIP1, CDH1, CDKN2A (p14ARF), CDKN2A (p16INK4a), CKD4, CHEK2, CTNNA1, DICER1, EPCAM (Deletion/duplication testing only), GREM1 (promoter region deletion/duplication testing only), KIT, MEN1, MLH1, MSH2, MSH3, MSH6, MUTYH, NBN, NF1, NHTL1, PALB2, PDGFRA, PMS2, POLD1, POLE, PTEN, RAD50, RAD51C, RAD51D, RNF43, SDHB, SDHC, SDHD, SMAD4, SMARCA4. STK11, TP53, TSC1, TSC2, and VHL.  The following genes were evaluated for sequence changes only: SDHA and HOXB13 c.251G>A variant only.  Based on Ms. Prins's personal and family history of cancer, she meets medical criteria for genetic testing. Despite that she meets criteria, she may still have an out of pocket cost.   PLAN: After considering the risks, benefits, and  limitations, Ms. Sanks provided informed consent to pursue genetic testing and the blood sample was sent to [facility] for analysis of the Breast Cancer STAT Panel + Common Hereditary Cancers Panel. Results should be available within approximately 5-12 days' time, at which point they will be disclosed by telephone to Ms. Harb, as will any additional recommendations warranted by these results. Ms. Lheureux will receive a summary of her genetic counseling visit and a copy of her results once available. This information will also be available in Epic.   Ms. Hendricksen questions were answered to her satisfaction today. Our contact information was provided should additional questions or concerns arise. Thank you for the referral and allowing [place] to share in the care of your patient.   Faith Rogue, MS, [facility] Genetic Counselor [email or web address] Phone: [phone]  The patient was seen for a total of 30 minutes in face-to-face genetic counseling.  Drs. Magrinat, Lindi Adie and/or Burr Medico were available for discussion regarding this case.   _______________________________________________________________________ For Office Staff:  Number of people involved in session: 1 Was an Intern/ student involved with case: no

## 2019-08-06 ENCOUNTER — Telehealth: Payer: Self-pay | Admitting: Oncology

## 2019-08-06 ENCOUNTER — Encounter: Payer: Self-pay | Admitting: Oncology

## 2019-08-06 ENCOUNTER — Other Ambulatory Visit: Payer: Self-pay | Admitting: Oncology

## 2019-08-06 ENCOUNTER — Encounter: Payer: Self-pay | Admitting: *Deleted

## 2019-08-06 NOTE — Telephone Encounter (Signed)
Scheduled per 01/15 scheduled message, patient has been called and notified. 

## 2019-08-08 NOTE — Progress Notes (Signed)
[place]  Telephone:([phone]) [phone] Fax:(336) [phone]     ID: Amy Fisher DOB: 1960-10-29  MR#: [id number]  QIO#:[id number]  Patient Care Team: Orpah Melter, MD as PCP - General (Family Medicine) Mauro Kaufmann, RN as Oncology Nurse Navigator [name], RN as Oncology Nurse Navigator Alphonsa Overall, MD as Consulting Physician (General Surgery) Lesbia Ottaway, Virgie Dad, MD as Consulting Physician (Oncology) Gery Pray, MD as Consulting Physician (Radiation Oncology) Chauncey Cruel, MD OTHER MD:  I connected with Amy Fisher on 08/09/19 at  2:30 PM EST by telephone visit and verified that I am speaking with the correct person using two identifiers.   I discussed the limitations, risks, security and privacy concerns of performing an evaluation and management service by telemedicine and the availability of in-person appointments. I also discussed with the patient that there may be a patient responsible charge related to this service. The patient expressed understanding and agreed to proceed.   Other persons participating in the visit and their role in the encounter: The patient's cousin Amy Fisher  Patient's location: home  Provider's location: [place]    CHIEF COMPLAINT: Estrogen receptor positive lobular breast cancer  CURRENT TREATMENT: Awaiting definitive surgery  INTERVAL HISTORY: Amy Fisher requested an additional appointment with me to discuss her situation with her cousin present.  Her cousin Amy Fisher is a Marine scientist.Marland Kitchen  Her case was presented at the multidisciplinary breast cancer conference on 08/04/2019. At that time a preliminary plan was proposed: Obtain an MRI prior to definitive surgery; adjuvant radiation, antiestrogens genetics testing, likely no chemotherapy but Oncotype will be sent from the definitive surgical sample.  In fact her breast MRI was performed today.  It shows the spiculated lesion previously biopsied, measuring 1.7  cm, with a satellite immediately contiguous to it.  Additionally though there are 2 very small (2 to 3 mm) indeterminate and mildly enhancing foci just inferior and medial to the index lesion.  The overall span of all this together is 1.9 cm there are no lymph nodes of concern and nothing of concern on the left breast.   REVIEW OF SYSTEMS: Amy Fisher has been trying to sort out whether she should have a lumpectomy or mastectomy.  She has been hearing anecdotes which have pushed her this way and that.  Review of systems was otherwise noncontributory today  HISTORY OF CURRENT ILLNESS: From the original intake note:  Amy Fisher had routine screening mammography on 07/07/2019 showing a possible abnormality in the left breast. She underwent left diagnostic mammography with tomography and left breast ultrasonography at [facility] on 07/14/2019 showing: breast density category C; irregular 1.5 cm mass at 4 o'clock in the left breast with a 0.4 cm adjacent satellite nodule; no left axillary adenopathy. Physical exam showed a 5 mm blue domed cyst in the left breast a 4 o'clock, palpable as a BB size superficial mass and a discrete palpable 2 cm mass approximately 2 cm below the previous.  Accordingly on 07/22/2019 she proceeded to biopsy of the left breast area in question. The pathology from this procedure ([id number]) showed: invasive mammary carcinoma, grade 2, e-cadherin negative. Prognostic indicators significant for: estrogen receptor, 100% positive and progesterone receptor, 95% positive, both with strong staining intensity. Proliferation marker Ki67 at 2%. HER2 equivocal by immunohistochemistry (2+), but negative by fluorescent in situ hybridization with a signals ratio 1.05 and number per cell 2.00.  The patient's subsequent history is as detailed below.  PAST MEDICAL HISTORY: Past Medical  History:  Diagnosis Date  . Cervical dysplasia    lgsil 2008, ascus 2009, normal Pap smears  afterwards  . Family history of breast cancer   . Fibroid   . Hypertension   Exercise-induced asthma, likely bilateral early carpal tunnel  PAST SURGICAL HISTORY: Past Surgical History:  Procedure Laterality Date  . IUD REMOVAL  07/2017   Mirena  . PILONIDAL CYST EXCISION      FAMILY HISTORY: Family History  Problem Relation Age of Onset  . Cancer Maternal Grandmother        ? type  . Breast cancer Mother 47  . Celiac disease Sister   . Alzheimer's disease Father   . Cancer Paternal Aunt        ? type  . Alzheimer's disease Paternal Grandfather   . Breast cancer Other        dx70s   Patient has 1 sister, 87, and 1 brother, 10, with no history of cancer. No cancers in her 3 nephews.  Amy Fisher mother was diagnosed with breast cancer at 58 and is living at 17. Patient has 1 maternal uncle, no cancers. No known cancers for her 2 female cousins. Patient's maternal grandmother did have cancer but is unsure the type. Maternal grandmother's sister did have breast cancer in her 49s. Maternal grandfather died in his 28s due to heart issues.  Amy Fisher father died at 6 due to Alzheimer's. Patient had 1 paternal aunt, she had cancer but unsure type. No cancers for her 3 paternal female cousins. Paternal grandparents both died in their 73s.  [name] is unaware of previous family history of genetic testing for hereditary cancer risks. Patient's maternal ancestors are of Caucasian descent, and paternal ancestors are of Caucasian descent. There is no reported Ashkenazi Jewish ancestry. There is no known consanguinity.  GYNECOLOGIC HISTORY:  Patient's last menstrual period was 03/31/2015. Menarche: 59 years old [place] P 1 LMP 2019 Contraceptive: history of Mirena IUD (removed 07/2017) HRT no Hysterectomy? no BSO? 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 She is divorced and lives by herself.  She had a son at age 65 whom she gave up for adoption but she has since reconstructed.  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  The patient has 1 grandchild.  She attends a local [employer] DIRECTIVES: To be discussed  HEALTH MAINTENANCE: Social History   Tobacco Use  . Smoking status: Never Smoker  . Smokeless tobacco: Never Used  Substance Use Topics  . Alcohol use: Yes    Alcohol/week: 0.0 standard drinks    Comment: OCC, WINE  . Drug use: No     Colonoscopy: 2016  PAP: 06/2013, negative (repeat due 06/2018)  Bone density:    No Known Allergies  Current Outpatient Medications  Medication Sig Dispense Refill  . acetaminophen (TYLENOL) 500 MG tablet Take by mouth.    Marland Kitchen albuterol (VENTOLIN HFA) 108 (90 Base) MCG/ACT inhaler     . benazepril (LOTENSIN) 10 MG tablet Take 10 mg by mouth daily.    . hydrochlorothiazide (HYDRODIURIL) 12.5 MG tablet Take 12.5 mg by mouth daily.    Marland Kitchen ibuprofen (ADVIL) 200 MG tablet Take by mouth.    . levonorgestrel (MIRENA) 20 MCG/24HR IUD 1 each by Intrauterine route once.    . Multiple Vitamin (MULTIVITAMIN) capsule Take by  mouth.    . Multiple Vitamin (MULTIVITAMIN) tablet Take 1 tablet by mouth daily.     No current facility-administered medications for this visit.   Facility-Administered Medications Ordered in Other Visits  Medication Dose Route Frequency Provider Last Rate Last Admin  . gadobutrol (GADAVIST) 1 MMOL/ML injection 6 mL  6 mL Intravenous Once PRN Rozetta Nunnery, MD        OBJECTIVE: Middle-aged white woman   There were no vitals filed for this visit.   There is no height or weight on file to calculate BMI.   Wt Readings from Last 3 Encounters:  08/05/19 123 lb 9.6 oz (56.1 kg)  07/25/17 113 lb (51.3 kg)  04/07/15 132 lb (59.9 kg)      ECOG FS:0 - Asymptomatic  Televisit   LAB RESULTS:  CMP     Component  Value Date/Time   NA 138 06/03/2012 0940   K 4.5 06/03/2012 0940   CL 105 06/03/2012 0940   CO2 25 06/03/2012 0940   GLUCOSE 85 06/03/2012 0940   BUN 13 06/03/2012 0940   CREATININE 0.69 06/03/2012 0940   CALCIUM 9.6 06/03/2012 0940   PROT 6.8 06/03/2012 0940   ALBUMIN 4.6 06/03/2012 0940   AST 15 06/03/2012 0940   ALT 13 06/03/2012 0940   ALKPHOS 38 (L) 06/03/2012 0940   BILITOT 0.5 06/03/2012 0940    No results found for: TOTALPROTELP, ALBUMINELP, A1GS, A2GS, BETS, BETA2SER, GAMS, MSPIKE, SPEI  Lab Results  Component Value Date   WBC 5.3 06/03/2012   NEUTROABS 3.2 06/03/2012   HGB 15.3 (H) 06/03/2012   HCT 44.5 06/03/2012   MCV 87.1 06/03/2012   PLT 192 06/03/2012    No results found for: LABCA2  No components found for: IRCVEL381  No results for input(s): INR in the last 168 hours.  No results found for: LABCA2  No results found for: OFB510  No results found for: CHE527  No results found for: POE423  No results found for: CA2729  No components found for: HGQUANT  No results found for: CEA1 / No results found for: CEA1   No results found for: AFPTUMOR  No results found for: CHROMOGRNA  No results found for: KPAFRELGTCHN, LAMBDASER, KAPLAMBRATIO (kappa/lambda light chains)  No results found for: HGBA, HGBA2QUANT, HGBFQUANT, HGBSQUAN (Hemoglobinopathy evaluation)   No results found for: LDH  No results found for: IRON, TIBC, IRONPCTSAT (Iron and TIBC)  No results found for: FERRITIN  Urinalysis    Component Value Date/Time   COLORURINE DARK YELLOW 04/07/2015 0850   APPEARANCEUR TURBID (A) 04/07/2015 0850   LABSPEC 1.031 04/07/2015 0850   PHURINE 5.5 04/07/2015 0850   GLUCOSEU NEGATIVE 04/07/2015 0850   HGBUR NEGATIVE 04/07/2015 0850   BILIRUBINUR NEGATIVE 04/07/2015 0850   KETONESUR NEGATIVE 04/07/2015 0850   PROTEINUR NEGATIVE 04/07/2015 0850   UROBILINOGEN 0.2 06/03/2012 0940   NITRITE NEGATIVE 04/07/2015 0850   LEUKOCYTESUR TRACE (A)  04/07/2015 0850     STUDIES: US BREAST LTD UNI LEFT INC AXILLA  Result Date: 07/14/2019 CLINICAL DATA:  The patient returns after screening study for evaluation of possible LEFT breast mass associated with distortion. EXAM: DIGITAL DIAGNOSTIC LEFT MAMMOGRAM WITH CAD AND TOMO ULTRASOUND LEFT BREAST COMPARISON:  07/07/2019 and earlier ACR Breast Density Category c: The breast tissue is heterogeneously dense, which may obscure small masses. FINDINGS: Additional 2-D and 3-D images are performed. These views confirm presence of distortion in the LOWER OUTER QUADRANT of the LEFT breast and further evaluated with  ultrasound. Additionally, there is a superficial smaller oval mass measuring 4 millimeters. This mass has decreased in size since evaluated in 2012. Ultrasound and diagnostic mammogram performed at that time showed a superficial cyst measuring 0.8 x 0.7 x 0.6 centimeters. This cyst was visible as a bluish superficial mass. Mammographic images were processed with CAD. On physical exam, there is a 5 millimeter blue domed cyst in the 4 o'clock location of the LEFT breast, palpable as a BB size superficial mass. Approximately 2 centimeters below this lesion there is a discrete palpable 2 centimeter mass. Targeted ultrasound is performed, showing an irregular mass with irregular margins in the 4 o'clock location of the LEFT breast 2 centimeters from the nipple. Mass measures 1.5 x 1.1 x 1.1 centimeters. An adjacent small satellite nodule is 0.4 x 0.3 x 0.3 centimeters. The smaller mass is 3 millimeters from the larger mass. Measured together, these are 2.0 centimeters in diameter. Doppler evaluation demonstrates significant internal blood flow. Evaluation of the LEFT axilla is negative for adenopathy. IMPRESSION: 1. Irregular mass in the 4 o'clock location of the LEFT breast for which biopsy is recommended. This lesion has an adjacent 4 millimeter satellite nodule, likely with similar pathology. I would  recommend biopsy of these adjacent lesions as a single lesion. 2. No LEFT axillary adenopathy. RECOMMENDATION: Ultrasound-guided core biopsy of LEFT breast mass 4 o'clock location. I have discussed the findings and recommendations with the patient. If applicable, a reminder letter will be sent to the patient regarding the next appointment. BI-RADS CATEGORY  4: Suspicious. Electronically Signed   By: Nolon Nations M.D.   On: 07/14/2019 14:00   MM DIAG BREAST TOMO UNI LEFT  Result Date: 07/14/2019 CLINICAL DATA:  The patient returns after screening study for evaluation of possible LEFT breast mass associated with distortion. EXAM: DIGITAL DIAGNOSTIC LEFT MAMMOGRAM WITH CAD AND TOMO ULTRASOUND LEFT BREAST COMPARISON:  07/07/2019 and earlier ACR Breast Density Category c: The breast tissue is heterogeneously dense, which may obscure small masses. FINDINGS: Additional 2-D and 3-D images are performed. These views confirm presence of distortion in the LOWER OUTER QUADRANT of the LEFT breast and further evaluated with ultrasound. Additionally, there is a superficial smaller oval mass measuring 4 millimeters. This mass has decreased in size since evaluated in 2012. Ultrasound and diagnostic mammogram performed at that time showed a superficial cyst measuring 0.8 x 0.7 x 0.6 centimeters. This cyst was visible as a bluish superficial mass. Mammographic images were processed with CAD. On physical exam, there is a 5 millimeter blue domed cyst in the 4 o'clock location of the LEFT breast, palpable as a BB size superficial mass. Approximately 2 centimeters below this lesion there is a discrete palpable 2 centimeter mass. Targeted ultrasound is performed, showing an irregular mass with irregular margins in the 4 o'clock location of the LEFT breast 2 centimeters from the nipple. Mass measures 1.5 x 1.1 x 1.1 centimeters. An adjacent small satellite nodule is 0.4 x 0.3 x 0.3 centimeters. The smaller mass is 3 millimeters  from the larger mass. Measured together, these are 2.0 centimeters in diameter. Doppler evaluation demonstrates significant internal blood flow. Evaluation of the LEFT axilla is negative for adenopathy. IMPRESSION: 1. Irregular mass in the 4 o'clock location of the LEFT breast for which biopsy is recommended. This lesion has an adjacent 4 millimeter satellite nodule, likely with similar pathology. I would recommend biopsy of these adjacent lesions as a single lesion. 2. No LEFT axillary adenopathy. RECOMMENDATION: Ultrasound-guided core biopsy  of LEFT breast mass 4 o'clock location. I have discussed the findings and recommendations with the patient. If applicable, a reminder letter will be sent to the patient regarding the next appointment. BI-RADS CATEGORY  4: Suspicious. Electronically Signed   By: Nolon Nations M.D.   On: 07/14/2019 14:00   MM CLIP PLACEMENT LEFT  Result Date: 07/22/2019 CLINICAL DATA:  Ultrasound-guided core needle biopsy was performed of a mass in the 4 o'clock position of the left breast. EXAM: DIAGNOSTIC LEFT MAMMOGRAM POST ULTRASOUND BIOPSY COMPARISON:  Previous exam(s). FINDINGS: Mammographic images were obtained following ultrasound guided biopsy of the left breast. The biopsy marking clip is in expected position at the site of biopsied mass in the lower outer quadrant. IMPRESSION: Appropriate positioning of the ribbon shaped biopsy marking clip at the site of biopsy in the 4 o'clock location. Final Assessment: Post Procedure Mammograms for Marker Placement Electronically Signed   By: Curlene Dolphin M.D.   On: 07/22/2019 13:35   [place] LT BREAST BX W LOC DEV 1ST LESION IMG BX SPEC US GUIDE  Addendum Date: 07/27/2019   ADDENDUM REPORT: 07/27/2019 08:17 ADDENDUM: Pathology revealed GRADE II INVASIVE MAMMARY CARCINOMA of the LEFT breast, 4 o'clock position. This was found to be concordant by Dr. Curlene Dolphin. Pathology results were discussed with the patient by telephone. The patient  reported doing well after the biopsy with tenderness at the site. Post biopsy instructions and care were reviewed and questions were answered. The patient was encouraged to call [facility] for any additional concerns. At the request of the patient, surgical consultation has been arranged with Dr. Alphonsa Overall at [facility] on July 29, 2019. Pathology results reported by Stacie Acres, RN on 07/27/2019. Electronically Signed   By: Curlene Dolphin M.D.   On: 07/27/2019 08:17   Result Date: 07/27/2019 CLINICAL DATA:  59 year old patient presents for ultrasound-guided biopsy of a left breast mass at 4 o'clock position. EXAM: ULTRASOUND GUIDED LEFT BREAST CORE NEEDLE BIOPSY COMPARISON:  Previous exam(s). FINDINGS: I met with the patient and we discussed the procedure of ultrasound-guided biopsy, including benefits and alternatives. We discussed the high likelihood of a successful procedure. We discussed the risks of the procedure, including infection, bleeding, tissue injury, clip migration, and inadequate sampling. Informed written consent was given. The usual time-out protocol was performed immediately prior to the procedure. Lesion quadrant: Lower outer quadrant Using sterile technique and 1% Lidocaine with and without epinephrine as local anesthetic, under direct ultrasound visualization, a 14 gauge spring-loaded device was used to perform biopsy of an irregular using a medial approach. At the conclusion of the procedure ribbon tissue marker clip was deployed into the biopsy cavity. Follow up 2 view mammogram was performed and dictated separately. IMPRESSION: Ultrasound guided biopsy of the right breast. No apparent complications. Electronically Signed: By: Curlene Dolphin M.D. On: 07/22/2019 13:24     ELIGIBLE FOR AVAILABLE RESEARCH PROTOCOL: No  ASSESSMENT: 59 y.o. [place], [place] woman status post left breast lower outer quadrant biopsy 07/22/2019 for a clinical T1c N0,  stage IA invasive lobular carcinoma, E-cadherin negative, estrogen and progesterone receptor positive, HER-2 not amplified, with an MIB-1 of 2%  (1) genetics testing 08/04/2018, results pending  (2) definitive surgery pending  (3) Oncotype to be obtained from the definitive surgical sample: Chemotherapy not anticipated  (4) adjuvant radiation to follow as appropriate  (5) antiestrogens to start of the completion of local treatment  PLAN: Whitney wanted to go over the  surgical decision again.  We discussed the fact that whether she has a lumpectomy or mastectomy does not alter long-term survival or the risk of distant recurrence.  What is going to push her one way or the other is what others Dr. [name] feels he can obtain good cosmesis with an adequate resection.  The MRI today should help with that and the good news is there is nothing in the left breast and there are no obviously involved lymph nodes.  The entire area including the 2 new small areas not previously noted are encompassed in a 1.9 cm area which is less than an inch.  I suggested to [place] that her neck step is getting together with Dr. [name] and I sent in the note letting him know that she and I have talked today  I am anticipating surgery within the next week or 2.  This will be followed by radiation.  She will return to see me in March.  At that point she should be able to initiate antiestrogens  She knows to call for any other issue that may develop before the next visit.   Chauncey Cruel, MD   08/09/2019 10:21 AM Medical Oncology and Hematology [facility] [place], [place] [address] Tel. [phone]    Fax. [phone]   This document serves as a record of services personally performed by Lurline Del, MD. It was created on his behalf by Wilburn Mylar, a trained medical scribe. The creation of this record is based on the scribe's personal observations and the provider's  statements to them.   I, Lurline Del MD, have reviewed the above documentation for accuracy and completeness, and I agree with the above.   *Total Encounter Time as defined by the Centers for Medicare and Medicaid Services includes, in addition to the face-to-face time of a patient visit (documented in the note above) non-face-to-face time: obtaining and reviewing outside history, ordering and reviewing medications, tests or procedures, care coordination (communications with other health care professionals or caregivers) and documentation in the medical record.

## 2019-08-09 ENCOUNTER — Inpatient Hospital Stay (HOSPITAL_BASED_OUTPATIENT_CLINIC_OR_DEPARTMENT_OTHER): Payer: 59 | Admitting: Oncology

## 2019-08-09 ENCOUNTER — Other Ambulatory Visit: Payer: Self-pay

## 2019-08-09 ENCOUNTER — Ambulatory Visit (HOSPITAL_COMMUNITY)
Admission: RE | Admit: 2019-08-09 | Discharge: 2019-08-09 | Disposition: A | Discharge status: Home / Self Care | Payer: 59 | Source: Ambulatory Visit | Attending: Otolaryngology | Admitting: Otolaryngology

## 2019-08-09 DIAGNOSIS — Z170 Estrogen receptor positive status [ER+]: Secondary | ICD-10-CM | POA: Diagnosis present

## 2019-08-09 DIAGNOSIS — C50912 Malignant neoplasm of unspecified site of left female breast: Secondary | ICD-10-CM

## 2019-08-09 DIAGNOSIS — C50512 Malignant neoplasm of lower-outer quadrant of left female breast: Secondary | ICD-10-CM | POA: Diagnosis not present

## 2019-08-09 MED ORDER — GADOBUTROL 1 MMOL/ML IV SOLN
6.0000 mL | Freq: Once | INTRAVENOUS | Status: AC | PRN
  Administered 2019-08-09: 6 mL via INTRAVENOUS

## 2019-08-09 NOTE — Progress Notes (Signed)
Radiation Oncology         [phone] ________________________________  Initial Outpatient Consultation  Name: Amy Fisher MRN: [id number]  Date: 08/11/2019  DOB: 10-Jun-1961  WC:HENIDP, Amy Main, MD  Amy Overall, MD   REFERRING PHYSICIAN: Alphonsa Overall, MD  DIAGNOSIS: The encounter diagnosis was Malignant neoplasm of lower-outer quadrant of left breast of female, estrogen receptor positive ([place]).  Stage cT1c, cN0 Left Breast LOQ, Invasive lobular Carcinoma, ER+ / PR+ / Her2-, Grade 2  HISTORY OF PRESENT ILLNESS::Amy Fisher is a 59 y.o. female who is accompanied by no one due to COVID-19 restrictions. She had routine screening mammography on 07/07/2019 that showed a possible mass with distortion in the left breast. She underwent unilateral diagnostic mammography and left breast ultrasonography on 07/14/2019 showing that showed an irregular mass in the 4 o'clock position with an adjacent 4 mm satellite nodule, likely with similar pathology. No left axillary adenopathy.  Biopsy on 12/32/2020 showed grade 2 invasive mammary carcinoma. Prognostic indicators significant for estrogen receptor, 100% positive and progesterone receptor, 95% positive, both with strong staining intensities. Proliferation marker Ki67 at 2%. HER2 negative.  Immunohistochemical stain for E-cadherin was negative consistent with lobular phenotype  Patient was seen by Dr. Jana Fisher on 08/05/2019 in the multidisciplinary breast clinic. Plan at that time was to obtain an MRI of breasts, proceed with left lumpectomy, radiation therapy, and antiestrogen therapy. Dr. Jana Fisher noted that they will order genetic testing, check an oncotype although chemotherapy is not anticipated, obtain an MRI of bilateral breasts, proceed with surgery (lumpectomy vs mastectomy pending), anticipate adjuvant radiation therapy, and begin antiestrogens.  Since consultation, she underwent genetic testing on 08/05/2019. Results were  negative.  MRI of bilateral breasts on 08/09/2019 showed known biopsy proven disease with a contiguous satellite lesion in the lower outer left breast. It also showed two additional 2-3 mm indeterminate, mildly enhancing foci seen just inferior and medial to the index lesion. While the enhancement kinetics are below threshold, the findings are indeterminate given the morphology and proximity to the known cancer. Fisher, findings span 1.9 x 1.7 x 2.0 cm. Wide surgical margins to include the tissue medial and inferior to the index lesion was recommended. No MRI evidence of malignancy on the right. No suspicious lymphadenopathy.  PREVIOUS RADIATION THERAPY: No  PAST MEDICAL HISTORY:  Past Medical History:  Diagnosis Date   Cervical dysplasia    lgsil 2008, ascus 2009, normal Pap smears afterwards   Family history of breast cancer    Fibroid    Hypertension     PAST SURGICAL HISTORY: Past Surgical History:  Procedure Laterality Date   IUD REMOVAL  07/2017   Mirena   PILONIDAL CYST EXCISION      FAMILY HISTORY:  Family History  Problem Relation Age of Onset   Cancer Maternal Grandmother        ? type   Breast cancer Mother 34   Celiac disease Sister    Alzheimer's disease Father    Cancer Paternal Aunt        ? type   Alzheimer's disease Paternal Grandfather    Breast cancer Other        dx70s    SOCIAL HISTORY:  Social History   Tobacco Use   Smoking status: Never Smoker   Smokeless tobacco: Never Used  Substance Use Topics   Alcohol use: Yes    Alcohol/week: 0.0 standard drinks    Comment: OCC, WINE   Drug use: No    ALLERGIES: No  Known Allergies  MEDICATIONS:  Current Outpatient Medications  Medication Sig Dispense Refill   acetaminophen (TYLENOL) 500 MG tablet Take by mouth.     albuterol (VENTOLIN HFA) 108 (90 Base) MCG/ACT inhaler      benazepril (LOTENSIN) 10 MG tablet Take 10 mg by mouth daily.     ibuprofen (ADVIL) 200 MG tablet  Take by mouth.     Multiple Vitamin (MULTIVITAMIN) tablet Take 1 tablet by mouth daily.     hydrochlorothiazide (HYDRODIURIL) 12.5 MG tablet Take 12.5 mg by mouth daily.     No current facility-administered medications for this encounter.    REVIEW OF SYSTEMS:  A 10+ POINT REVIEW OF SYSTEMS WAS OBTAINED including neurology, dermatology, psychiatry, cardiac, respiratory, lymph, extremities, GI, GU, musculoskeletal, constitutional, reproductive, HEENT.  Prior to diagnosis the patient denied any pain in the breast area nipple discharge or bleeding   PHYSICAL EXAM:  height is '5\' 5"'  (1.651 m) and weight is 124 lb (56.2 kg). Her temporal temperature is 98.7 F (37.1 C). Her blood pressure is 110/66 and her pulse is 61. Her respiration is 18 and oxygen saturation is 100%.   General: Alert and oriented, in no acute distress HEENT: Head is normocephalic. Extraocular movements are intact.  Neck: Neck is supple, no palpable cervical or supraclavicular lymphadenopathy. Heart: Regular in rate and rhythm with no murmurs, rubs, or gallops. Chest: Clear to auscultation bilaterally, with no rhonchi, wheezes, or rales. Abdomen: Soft, nontender, nondistended, with no rigidity or guarding. Extremities: No cyanosis or edema. Lymphatics: see Neck Exam Skin: No concerning lesions. Musculoskeletal: symmetric strength and muscle tone throughout. Neurologic: Cranial nerves II through XII are grossly intact. No obvious focalities. Speech is fluent. Coordination is intact. Psychiatric: Judgment and insight are intact. Affect is appropriate. Right breast: No palpable mass, nipple discharge or bleeding. Left breast: Palpable induration in the lower outer aspect of the breast with some associated bruising.  Area measures approximately 2 x 2 and half centimeters  ECOG = 0  0 - Asymptomatic (Fully active, able to carry on all predisease activities without restriction)  1 - Symptomatic but completely ambulatory  (Restricted in physically strenuous activity but ambulatory and able to carry out work of a light or sedentary nature. For example, light housework, office work)  2 - Symptomatic, <50% in bed during the day (Ambulatory and capable of all self care but unable to carry out any work activities. Up and about more than 50% of waking hours)  3 - Symptomatic, >50% in bed, but not bedbound (Capable of only limited self-care, confined to bed or chair 50% or more of waking hours)  4 - Bedbound (Completely disabled. Cannot carry on any self-care. Totally confined to bed or chair)  5 - Death   Eustace Pen MM, Creech RH, Tormey DC, et al. [phone]). "Toxicity and response criteria of the [facility]". [place] Oncol. 5 (6): 649-55  LABORATORY DATA:  Lab Results  Component Value Date   WBC 5.3 06/03/2012   HGB 15.3 (H) 06/03/2012   HCT 44.5 06/03/2012   MCV 87.1 06/03/2012   PLT 192 06/03/2012   NEUTROABS 3.2 06/03/2012   Lab Results  Component Value Date   NA 138 06/03/2012   K 4.5 06/03/2012   CL 105 06/03/2012   CO2 25 06/03/2012   GLUCOSE 85 06/03/2012   CREATININE 0.69 06/03/2012   CALCIUM 9.6 06/03/2012      RADIOGRAPHY: US BREAST LTD UNI LEFT INC AXILLA  Result Date: 07/14/2019  CLINICAL DATA:  The patient returns after screening study for evaluation of possible LEFT breast mass associated with distortion. EXAM: DIGITAL DIAGNOSTIC LEFT MAMMOGRAM WITH CAD AND TOMO ULTRASOUND LEFT BREAST COMPARISON:  07/07/2019 and earlier ACR Breast Density Category c: The breast tissue is heterogeneously dense, which may obscure small masses. FINDINGS: Additional 2-D and 3-D images are performed. These views confirm presence of distortion in the LOWER OUTER QUADRANT of the LEFT breast and further evaluated with ultrasound. Additionally, there is a superficial smaller oval mass measuring 4 millimeters. This mass has decreased in size since evaluated in 2012. Ultrasound and diagnostic  mammogram performed at that time showed a superficial cyst measuring 0.8 x 0.7 x 0.6 centimeters. This cyst was visible as a bluish superficial mass. Mammographic images were processed with CAD. On physical exam, there is a 5 millimeter blue domed cyst in the 4 o'clock location of the LEFT breast, palpable as a BB size superficial mass. Approximately 2 centimeters below this lesion there is a discrete palpable 2 centimeter mass. Targeted ultrasound is performed, showing an irregular mass with irregular margins in the 4 o'clock location of the LEFT breast 2 centimeters from the nipple. Mass measures 1.5 x 1.1 x 1.1 centimeters. An adjacent small satellite nodule is 0.4 x 0.3 x 0.3 centimeters. The smaller mass is 3 millimeters from the larger mass. Measured together, these are 2.0 centimeters in diameter. Doppler evaluation demonstrates significant internal blood flow. Evaluation of the LEFT axilla is negative for adenopathy. IMPRESSION: 1. Irregular mass in the 4 o'clock location of the LEFT breast for which biopsy is recommended. This lesion has an adjacent 4 millimeter satellite nodule, likely with similar pathology. I would recommend biopsy of these adjacent lesions as a single lesion. 2. No LEFT axillary adenopathy. RECOMMENDATION: Ultrasound-guided core biopsy of LEFT breast mass 4 o'clock location. I have discussed the findings and recommendations with the patient. If applicable, a reminder letter will be sent to the patient regarding the next appointment. BI-RADS CATEGORY  4: Suspicious. Electronically Signed   By: Nolon Nations M.D.   On: 07/14/2019 14:00   MM DIAG BREAST TOMO UNI LEFT  Result Date: 07/14/2019 CLINICAL DATA:  The patient returns after screening study for evaluation of possible LEFT breast mass associated with distortion. EXAM: DIGITAL DIAGNOSTIC LEFT MAMMOGRAM WITH CAD AND TOMO ULTRASOUND LEFT BREAST COMPARISON:  07/07/2019 and earlier ACR Breast Density Category c: The breast tissue  is heterogeneously dense, which may obscure small masses. FINDINGS: Additional 2-D and 3-D images are performed. These views confirm presence of distortion in the LOWER OUTER QUADRANT of the LEFT breast and further evaluated with ultrasound. Additionally, there is a superficial smaller oval mass measuring 4 millimeters. This mass has decreased in size since evaluated in 2012. Ultrasound and diagnostic mammogram performed at that time showed a superficial cyst measuring 0.8 x 0.7 x 0.6 centimeters. This cyst was visible as a bluish superficial mass. Mammographic images were processed with CAD. On physical exam, there is a 5 millimeter blue domed cyst in the 4 o'clock location of the LEFT breast, palpable as a BB size superficial mass. Approximately 2 centimeters below this lesion there is a discrete palpable 2 centimeter mass. Targeted ultrasound is performed, showing an irregular mass with irregular margins in the 4 o'clock location of the LEFT breast 2 centimeters from the nipple. Mass measures 1.5 x 1.1 x 1.1 centimeters. An adjacent small satellite nodule is 0.4 x 0.3 x 0.3 centimeters. The smaller mass is 3 millimeters from  the larger mass. Measured together, these are 2.0 centimeters in diameter. Doppler evaluation demonstrates significant internal blood flow. Evaluation of the LEFT axilla is negative for adenopathy. IMPRESSION: 1. Irregular mass in the 4 o'clock location of the LEFT breast for which biopsy is recommended. This lesion has an adjacent 4 millimeter satellite nodule, likely with similar pathology. I would recommend biopsy of these adjacent lesions as a single lesion. 2. No LEFT axillary adenopathy. RECOMMENDATION: Ultrasound-guided core biopsy of LEFT breast mass 4 o'clock location. I have discussed the findings and recommendations with the patient. If applicable, a reminder letter will be sent to the patient regarding the next appointment. BI-RADS CATEGORY  4: Suspicious. Electronically Signed    By: Nolon Nations M.D.   On: 07/14/2019 14:00   MM CLIP PLACEMENT LEFT  Result Date: 07/22/2019 CLINICAL DATA:  Ultrasound-guided core needle biopsy was performed of a mass in the 4 o'clock position of the left breast. EXAM: DIAGNOSTIC LEFT MAMMOGRAM POST ULTRASOUND BIOPSY COMPARISON:  Previous exam(s). FINDINGS: Mammographic images were obtained following ultrasound guided biopsy of the left breast. The biopsy marking clip is in expected position at the site of biopsied mass in the lower outer quadrant. IMPRESSION: Appropriate positioning of the ribbon shaped biopsy marking clip at the site of biopsy in the 4 o'clock location. Final Assessment: Post Procedure Mammograms for Marker Placement Electronically Signed   By: Curlene Dolphin M.D.   On: 07/22/2019 13:35   [place] LT BREAST BX W LOC DEV 1ST LESION IMG BX SPEC US GUIDE  Addendum Date: 07/27/2019   ADDENDUM REPORT: 07/27/2019 08:17 ADDENDUM: Pathology revealed GRADE II INVASIVE MAMMARY CARCINOMA of the LEFT breast, 4 o'clock position. This was found to be concordant by Dr. Curlene Dolphin. Pathology results were discussed with the patient by telephone. The patient reported doing well after the biopsy with tenderness at the site. Post biopsy instructions and care were reviewed and questions were answered. The patient was encouraged to call [facility] for any additional concerns. At the request of the patient, surgical consultation has been arranged with Dr. Alphonsa Fisher at [facility] on July 29, 2019. Pathology results reported by Stacie Acres, RN on 07/27/2019. Electronically Signed   By: Curlene Dolphin M.D.   On: 07/27/2019 08:17   Result Date: 07/27/2019 CLINICAL DATA:  59 year old patient presents for ultrasound-guided biopsy of a left breast mass at 4 o'clock position. EXAM: ULTRASOUND GUIDED LEFT BREAST CORE NEEDLE BIOPSY COMPARISON:  Previous exam(s). FINDINGS: I met with the patient and we discussed the  procedure of ultrasound-guided biopsy, including benefits and alternatives. We discussed the high likelihood of a successful procedure. We discussed the risks of the procedure, including infection, bleeding, tissue injury, clip migration, and inadequate sampling. Informed written consent was given. The usual time-out protocol was performed immediately prior to the procedure. Lesion quadrant: Lower outer quadrant Using sterile technique and 1% Lidocaine with and without epinephrine as local anesthetic, under direct ultrasound visualization, a 14 gauge spring-loaded device was used to perform biopsy of an irregular using a medial approach. At the conclusion of the procedure ribbon tissue marker clip was deployed into the biopsy cavity. Follow up 2 view mammogram was performed and dictated separately. IMPRESSION: Ultrasound guided biopsy of the right breast. No apparent complications. Electronically Signed: By: Curlene Dolphin M.D. On: 07/22/2019 13:24      IMPRESSION: Stage cT1c, cN0 Left Breast LOQ, Invasive lobubular Carcinoma, ER+ / PR+ / Her2-, Grade 2  Patient will  be a good candidate for breast conservation with lumpectomy,  sentinel node procedure and adjuvant radiation therapy.  Based on the size of the lesion and the patient's breast size there may be some architectural distortion which she understands but  would prefer this surgical approach over a mastectomy.  Today, I talked to the patient  about the findings and work-up thus far.  We discussed the natural history of breast cancer and general treatment, highlighting the role of radiotherapy in the management.  We discussed the available radiation techniques, and focused on the details of logistics and delivery.  We reviewed the anticipated acute and late sequelae associated with radiation in this setting.  The patient was encouraged to ask questions that I answered to the best of my ability.  Patient would appear to be a candidate for hypofractionated  accelerated radiation therapy over approximately 3-1/2 to 4 weeks unless her lymph nodes turns positive with the sentinel node procedure.  PLAN:   1.  Lumpectomy and sentinel node procedure 2.  Adjuvant radiation therapy 3.  Aromatase inhibitor    ------------------------------------------------  Blair Promise, PhD, MD  This document serves as a record of services personally performed by Gery Pray, MD. It was created on his behalf by Clerance Lav, a trained medical scribe. The creation of this record is based on the scribe's personal observations and the provider's statements to them. This document has been checked and approved by the attending provider.

## 2019-08-09 NOTE — Progress Notes (Signed)
Location of Breast Cancer:  Estrogen receptor positive lobular breast cancer, LEFT  Histology per Pathology Report: 07/22/19:  Diagnosis Breast, left, needle core biopsy, 4 o'clock position - INVASIVE MAMMARY CARCINOMA. SEE NOTE Diagnosis Note Carcinoma measures 1.0 cm in greatest linear dimension and appears grade 2.  Receptor Status: ER(100%), PR (95%), Her2-neu (equivocal by immunohistochemistry (2+), but negative by fluorescent in situ hybridization with a signals ratio 1.05 and number per cell 2.00.), Ki-(2%)  Did patient present with symptoms (if so, please note symptoms) or was this found on screening mammography?: Amy Fisher had routine screening mammography on 07/07/2019 showing a possible abnormality in the left breast. She underwent left diagnostic mammography with tomography and left breast ultrasonography at [facility] on 07/14/2019 showing: breast density category C; irregular 1.5 cm mass at 4 o'clock in the left breast with a 0.4 cm adjacent satellite nodule; no left axillary adenopathy. Physical exam showed a 5 mm blue domed cyst in the left breast a 4 o'clock, palpable as a BB size superficial mass and a discrete palpable 2 cm mass approximately 2 cm below the previous.  Accordingly on 07/22/2019 she proceeded to biopsy of the left breast area in question. The pathology from this procedure ([id number]) showed: invasive mammary carcinoma, grade 2, e-cadherin negative. Prognostic indicators significant for: estrogen receptor, 100% positive and progesterone receptor, 95% positive, both with strong staining intensity. Proliferation marker Ki67 at 2%. HER2 equivocal by immunohistochemistry (2+), but negative by fluorescent in situ hybridization with a signals ratio 1.05 and number per cell 2.00.  Past/Anticipated interventions by surgeon, if any: Awaiting definitive surgery  Past/Anticipated interventions by medical oncology, if any: Chemotherapy Per Dr. Jana Hakim 08/09/19:  PLAN: Amy Fisher wanted to go over the surgical decision again.  We discussed the fact that whether she has a lumpectomy or mastectomy does not alter long-term survival or the risk of distant recurrence.  What is going to push her one way or the other is what others Dr. [name] feels he can obtain good cosmesis with an adequate resection.  The MRI today should help with that and the good news is there is nothing in the left breast and there are no obviously involved lymph nodes.  The entire area including the 2 new small areas not previously noted are encompassed in a 1.9 cm area which is less than an inch.  I suggested to Amy Fisher that her neck step is getting together with Dr. [name] and I sent in the note letting him know that she and I have talked today  I am anticipating surgery within the next week or 2.  This will be followed by radiation.  She will return to see me in March.  At that point she should be able to initiate antiestrogens  She knows to call for any other issue that may develop before the next visit.  Lymphedema issues, if any:  Pt has not had surgery yet.  Pain issues, if any: Pt c/o "tightness" in breast, rated 1/10  SAFETY ISSUES:  Prior radiation? no  Pacemaker/ICD? no  Possible current pregnancy?no  Is the patient on methotrexate? no  Current Complaints / other details:  Pt presents today for initial consult with Dr. Sondra Come for Radiation Oncology. Pt is anxious.  BP 110/66 (BP Location: Left Arm, Patient Position: Sitting)   Pulse 61   Temp 98.7 F (37.1 C) (Temporal)   Resp 18   Ht 5' 5" (1.651 m)   Wt 124 lb (56.2 kg)   LMP  03/31/2015   SpO2 100%   BMI 20.63 kg/m   Wt Readings from Last 3 Encounters:  08/11/19 124 lb (56.2 kg)  08/05/19 123 lb 9.6 oz (56.1 kg)  07/25/17 113 lb (51.3 kg)       Loma Sousa, RN 08/11/2019,12:54 PM

## 2019-08-10 ENCOUNTER — Other Ambulatory Visit: Payer: Self-pay | Admitting: Surgery

## 2019-08-10 ENCOUNTER — Encounter: Payer: Self-pay | Admitting: *Deleted

## 2019-08-10 ENCOUNTER — Other Ambulatory Visit (HOSPITAL_COMMUNITY): Payer: Self-pay | Admitting: Surgery

## 2019-08-10 DIAGNOSIS — C50912 Malignant neoplasm of unspecified site of left female breast: Secondary | ICD-10-CM

## 2019-08-10 DIAGNOSIS — C50512 Malignant neoplasm of lower-outer quadrant of left female breast: Secondary | ICD-10-CM

## 2019-08-10 DIAGNOSIS — Z170 Estrogen receptor positive status [ER+]: Secondary | ICD-10-CM

## 2019-08-11 ENCOUNTER — Other Ambulatory Visit (HOSPITAL_COMMUNITY): Payer: Self-pay | Admitting: Surgery

## 2019-08-11 ENCOUNTER — Other Ambulatory Visit: Payer: Self-pay

## 2019-08-11 ENCOUNTER — Ambulatory Visit
Admission: RE | Admit: 2019-08-11 | Discharge: 2019-08-11 | Disposition: A | Discharge status: Home / Self Care | Payer: 59 | Source: Ambulatory Visit | Attending: Radiation Oncology | Admitting: Radiation Oncology

## 2019-08-11 ENCOUNTER — Encounter: Payer: Self-pay | Admitting: Radiation Oncology

## 2019-08-11 VITALS — BP 110/66 | HR 61 | Temp 98.7°F | Resp 18 | Ht 65.0 in | Wt 124.0 lb

## 2019-08-11 DIAGNOSIS — C50512 Malignant neoplasm of lower-outer quadrant of left female breast: Secondary | ICD-10-CM

## 2019-08-11 DIAGNOSIS — Z170 Estrogen receptor positive status [ER+]: Secondary | ICD-10-CM | POA: Diagnosis not present

## 2019-08-11 DIAGNOSIS — I10 Essential (primary) hypertension: Secondary | ICD-10-CM | POA: Diagnosis not present

## 2019-08-11 DIAGNOSIS — Z803 Family history of malignant neoplasm of breast: Secondary | ICD-10-CM | POA: Insufficient documentation

## 2019-08-11 DIAGNOSIS — Z79899 Other long term (current) drug therapy: Secondary | ICD-10-CM | POA: Diagnosis not present

## 2019-08-11 NOTE — Patient Instructions (Signed)
Coronavirus (COVID-19) Are you at risk?  Are you at risk for the Coronavirus (COVID-19)?  To be considered HIGH RISK for Coronavirus (COVID-19), you have to meet the following criteria:  . Traveled to [place], [place], [place], [place] or [place]; or in the [place] to [place], [place], [place], or [place]; and have fever, cough, and shortness of breath within the last 2 weeks of travel OR . Been in close contact with a person diagnosed with COVID-19 within the last 2 weeks and have fever, cough, and shortness of breath . IF YOU DO NOT MEET THESE CRITERIA, YOU ARE CONSIDERED LOW RISK FOR COVID-19.  What to do if you are HIGH RISK for COVID-19?  . If you are having a medical emergency, call 911. . Seek medical care right away. Before you go to a doctor's office, urgent care or emergency department, call ahead and tell them about your recent travel, contact with someone diagnosed with COVID-19, and your symptoms. You should receive instructions from your physician's office regarding next steps of care.  . When you arrive at healthcare provider, tell the healthcare staff immediately you have returned from visiting [place], [place], [place], [place] or [place]; or traveled in the [place] to [place], [place], [place], or [place]; in the last two weeks or you have been in close contact with a person diagnosed with COVID-19 in the last 2 weeks.   . Tell the health care staff about your symptoms: fever, cough and shortness of breath. . After you have been seen by a medical provider, you will be either: o Tested for (COVID-19) and discharged home on quarantine except to seek medical care if symptoms worsen, and asked to  - Stay home and avoid contact with others until you get your results (4-5 days)  - Avoid travel on public transportation if possible (such as bus, train, or airplane) or o Sent to the Emergency Department by EMS for evaluation, COVID-19 testing, and possible  admission depending on your condition and test results.  What to do if you are LOW RISK for COVID-19?  Reduce your risk of any infection by using the same precautions used for avoiding the common cold or flu:  . Wash your hands often with soap and warm water for at least 20 seconds.  If soap and water are not readily available, use an alcohol-based hand sanitizer with at least 60% alcohol.  . If coughing or sneezing, cover your mouth and nose by coughing or sneezing into the elbow areas of your shirt or coat, into a tissue or into your sleeve (not your hands). . Avoid shaking hands with others and consider head nods or verbal greetings only. . Avoid touching your eyes, nose, or mouth with unwashed hands.  . Avoid close contact with people who are sick. . Avoid places or events with large numbers of people in one location, like concerts or sporting events. . Carefully consider travel plans you have or are making. . If you are planning any travel outside or inside the [place], visit the CDC's Travelers' Health webpage for the latest health notices. . If you have some symptoms but not all symptoms, continue to monitor at home and seek medical attention if your symptoms worsen. . If you are having a medical emergency, call 911.   ADDITIONAL HEALTHCARE OPTIONS FOR PATIENTS  [facility] / e-Visit: [email or web address]         [facility]: [phone]  [facility]: [phone]                   [facility]: [phone]   

## 2019-08-16 ENCOUNTER — Other Ambulatory Visit: Payer: Self-pay

## 2019-08-17 ENCOUNTER — Ambulatory Visit (INDEPENDENT_AMBULATORY_CARE_PROVIDER_SITE_OTHER): Payer: 59 | Admitting: Obstetrics and Gynecology

## 2019-08-17 ENCOUNTER — Encounter: Payer: Self-pay | Admitting: Obstetrics and Gynecology

## 2019-08-17 VITALS — BP 122/80 | Ht 65.0 in | Wt 123.0 lb

## 2019-08-17 DIAGNOSIS — Z1151 Encounter for screening for human papillomavirus (HPV): Secondary | ICD-10-CM | POA: Diagnosis not present

## 2019-08-17 DIAGNOSIS — Z01411 Encounter for gynecological examination (general) (routine) with abnormal findings: Secondary | ICD-10-CM | POA: Diagnosis not present

## 2019-08-17 NOTE — Addendum Note (Signed)
Addended by: Nelva Nay on: 08/17/2019 12:34 PM   Modules accepted: Orders

## 2019-08-17 NOTE — Progress Notes (Signed)
Amy Fisher 06-11-1961 [id number]  SUBJECTIVE:  59 y.o. G0P0 female for annual routine gynecologic exam and Pap smear. She has no gynecologic concerns.  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  Her last mammogram was 2016 prior to the one 06/2019 that identified the left breast lesion.  She states she has a lumpectomy planned next month.  She states she has not had any periods since right after her IUD was removed in 2019.    Current Outpatient Medications  Medication Sig Dispense Refill  . albuterol (VENTOLIN HFA) 108 (90 Base) MCG/ACT inhaler Inhale 2 puffs into the lungs every 6 (six) hours as needed for wheezing or shortness of breath.     Marland Kitchen ascorbic acid (VITAMIN C) 500 MG tablet Take 500 mg by mouth daily.    . benazepril (LOTENSIN) 10 MG tablet Take 10 mg by mouth daily.    . cholecalciferol (VITAMIN D3) 25 MCG (1000 UNIT) tablet Take 1,000 Units by mouth daily.    Marland Kitchen ibuprofen (ADVIL) 200 MG tablet Take by mouth.    . Melatonin 10 MG TABS Take 10-20 mg by mouth at bedtime as needed (sleep).    . Multiple Vitamin (MULTIVITAMIN) tablet Take 1 tablet by mouth daily.    . vitamin B-12 (CYANOCOBALAMIN) 1000 MCG tablet Take 1,000 mcg by mouth daily.     No current facility-administered medications for this visit.   Allergies: Patient has no known allergies.  Patient's last menstrual period was 03/31/2015.  Past medical history,surgical history, problem list, medications, allergies, family history and social history were all reviewed and documented as reviewed in the EPIC chart.  ROS:  Feeling well. No dyspnea or chest pain on exertion.  No abdominal pain, change in bowel habits, black or bloody stools.  No urinary tract symptoms. GYN ROS:  no abnormal bleeding, pelvic pain or discharge, no breast pain or new or enlarging lumps on self exam. No neurological  complaints.   OBJECTIVE:  BP 122/80   Ht '5\' 5"'  (1.651 m)   Wt 123 lb (55.8 kg)   LMP 03/31/2015   BMI 20.47 kg/m  The patient appears well, alert, oriented x 3, in no distress. ENT normal.  Neck supple. No cervical or supraclavicular adenopathy or thyromegaly.  Lungs are clear, good air entry, no wheezes, rhonchi or rales. S1 and S2 normal, no murmurs, regular rate and rhythm.  Abdomen soft without tenderness, guarding, mass or organomegaly.  Neurological is normal, no focal findings.  BREAST EXAM: deferred given recent diagnosis and imaging  PELVIC EXAM: VULVA: normal appearing vulva with no masses, tenderness or lesions, VAGINA: normal appearing vagina with normal color and discharge, no lesions, CERVIX: long Pederson speculum required to view cervix due to length of vaginal canal and posterior positioning, normal appearing cervix without discharge or lesions, UTERUS: uterus is normal size, shape, consistency and nontender, ADNEXA: normal adnexa in size, nontender and no masses  Chaperone: Caryn Bee present during the examination  ASSESSMENT:  59 y.o. G0P0 here for annual gynecologic exam  PLAN:  1. Postmenopausal.  No new concerns today. 2. Pap smear 06/2013. Pap smear is repeated today.  History of LGSIL 2008, ASCUS 2009 and normal Pap smears since then.  3. Left breast cancer.  Recently just diagnosed. Mammogram 06/2019. Plans in place for a lumpectomy and sentinel node procedure followed by adjuvant radiation therapy and an aromatase inhibitor. 4. Colonoscopy 2016. Recommended that  she continue per the prescribed interval. 5. 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 Normal pelvic exam today. 6. Health maintenance.  No lab work as she has this completed with her primary care provider.    Return annually or sooner prn.  Larey Days MD  08/17/19

## 2019-08-18 LAB — PAP IG AND HPV HIGH-RISK: HPV DNA High Risk: DETECTED — AB

## 2019-08-19 ENCOUNTER — Telehealth: Payer: Self-pay | Admitting: Licensed Clinical Social Worker

## 2019-08-19 ENCOUNTER — Encounter: Payer: Self-pay | Admitting: *Deleted

## 2019-08-19 ENCOUNTER — Encounter: Payer: Self-pay | Admitting: Licensed Clinical Social Worker

## 2019-08-19 ENCOUNTER — Ambulatory Visit: Payer: Self-pay | Admitting: Licensed Clinical Social Worker

## 2019-08-19 DIAGNOSIS — C50512 Malignant neoplasm of lower-outer quadrant of left female breast: Secondary | ICD-10-CM

## 2019-08-19 DIAGNOSIS — Z1379 Encounter for other screening for genetic and chromosomal anomalies: Secondary | ICD-10-CM | POA: Insufficient documentation

## 2019-08-19 DIAGNOSIS — Z803 Family history of malignant neoplasm of breast: Secondary | ICD-10-CM

## 2019-08-19 NOTE — Telephone Encounter (Signed)
Revealed negative genetic testing.   We discussed that we do not know why she has breast cancer or why there is cancer in the family. It could be due to a different gene that we are not testing, or something our current technology cannot pick up.  It will be important for her to keep in contact with genetics to learn if additional testing may be needed in the future.  

## 2019-08-19 NOTE — Progress Notes (Signed)
HPI:  Amy Fisher was previously seen in the [place] clinic due to a personal and family history of cancer and concerns regarding a hereditary predisposition to cancer. Please refer to our prior cancer genetics clinic note for more information regarding our discussion, assessment and recommendations, at the time. Amy Fisher recent genetic test results were disclosed to her, as were recommendations warranted by these results. These results and recommendations are discussed in more detail below.  CANCER HISTORY:  Oncology History  Malignant neoplasm of lower-outer quadrant of left breast of female, estrogen receptor positive ([facility])  07/22/2019 Cancer Staging   Staging form: Breast, AJCC 8th Edition - Clinical stage from 07/22/2019: Stage IA (cT1c, cN0, cM0, G2, ER+, PR+, HER2-) - Signed by Gardenia Phlegm, NP on 08/04/2019   08/04/2019 Initial Diagnosis   Malignant neoplasm of lower-outer quadrant of left breast of female, estrogen receptor positive ([facility])    Genetic Testing   Negative genetic testing. No pathogenic variants identified on the [facility] Breast Cancer STAT Panel + Common Hereditary Cancers Panel. The report date is 08/18/2019.  The STAT Breast cancer panel offered by [facility] includes sequencing and rearrangement analysis for the following 9 genes:  ATM, BRCA1, BRCA2, CDH1, CHEK2, PALB2, PTEN, STK11 and TP53.   The Common Hereditary Cancers Panel offered by [facility] includes sequencing and/or deletion duplication testing of the following 48 genes: APC, ATM, AXIN2, BARD1, BMPR1A, BRCA1, BRCA2, BRIP1, CDH1, CDKN2A (p14ARF), CDKN2A (p16INK4a), CKD4, CHEK2, CTNNA1, DICER1, EPCAM (Deletion/duplication testing only), GREM1 (promoter region deletion/duplication testing only), KIT, MEN1, MLH1, MSH2, MSH3, MSH6, MUTYH, NBN, NF1, NHTL1, PALB2, PDGFRA, PMS2, POLD1, POLE, PTEN, RAD50, RAD51C, RAD51D, RNF43, SDHB, SDHC, SDHD, SMAD4, SMARCA4. STK11, TP53, TSC1, TSC2, and VHL.  The  following genes were evaluated for sequence changes only: SDHA and HOXB13 c.251G>A variant only.     FAMILY HISTORY:  We obtained a detailed, 4-generation family history.  Significant diagnoses are listed below: Family History  Problem Relation Age of Onset  . Cancer Maternal Grandmother        ? type  . Breast cancer Mother 74  . Celiac disease Sister   . Alzheimer's disease Father   . Cancer Paternal Aunt        ? type  . Alzheimer's disease Paternal Grandfather   . Breast cancer Other        dx70s    Amy Fisher has 1 son, he was adopted out but she does have contact with him, no cancer history. Patient has 1 sister, 42, and 1 brother, 27, with no history of cancer. No cancers in her 3 nephews.  Amy Fisher mother was diagnosed with breast cancer at 27 and is living at 13. Patient has 1 maternal uncle, no cancers. No known cancers for her 2 female cousins. Patient's maternal grandmother did have cancer but is unsure the type. Maternal grandmother's sister did have breast cancer in her 61s. Maternal grandfather died in his 84s due to heart issues.  Amy Fisher father died at 104 due to Alzheimer's. Patient had 1 paternal aunt, she had cancer but unsure type. No cancers for her 3 paternal female cousins. Paternal grandparents both died in their 28s.  Amy Fisher is unaware of previous family history of genetic testing for hereditary cancer risks. Patient's maternal ancestors are of Caucasian descent, and paternal ancestors are of Caucasian descent. There is no reported Ashkenazi Jewish ancestry. There is no known consanguinity.  GENETIC TEST RESULTS: Genetic testing reported out on 08/18/2019 through  the Breast Cancer STAT Panel + Common Hereditary cancer panel found no pathogenic mutations.   The STAT Breast cancer panel offered by [facility] includes sequencing and rearrangement analysis for the following 9 genes:  ATM, BRCA1, BRCA2, CDH1, CHEK2, PALB2, PTEN, STK11 and TP53.    The  Common Hereditary Cancers Panel offered by [facility] includes sequencing and/or deletion duplication testing of the following 48 genes: APC, ATM, AXIN2, BARD1, BMPR1A, BRCA1, BRCA2, BRIP1, CDH1, CDKN2A (p14ARF), CDKN2A (p16INK4a), CKD4, CHEK2, CTNNA1, DICER1, EPCAM (Deletion/duplication testing only), GREM1 (promoter region deletion/duplication testing only), KIT, MEN1, MLH1, MSH2, MSH3, MSH6, MUTYH, NBN, NF1, NHTL1, PALB2, PDGFRA, PMS2, POLD1, POLE, PTEN, RAD50, RAD51C, RAD51D, RNF43, SDHB, SDHC, SDHD, SMAD4, SMARCA4. STK11, TP53, TSC1, TSC2, and VHL.  The following genes were evaluated for sequence changes only: SDHA and HOXB13 c.251G>A variant only.  The test report has been scanned into EPIC and is located under the Molecular Pathology section of the Results Review tab.  A portion of the result report is included below for reference.     We discussed with Amy Fisher that because current genetic testing is not perfect, it is possible there may be a gene mutation in one of these genes that current testing cannot detect, but that chance is small.  We also discussed, that there could be another gene that has not yet been discovered, or that we have not yet tested, that is responsible for the cancer diagnoses in the family. It is also possible there is a hereditary cause for the cancer in the family that Amy Fisher did not inherit and therefore was not identified in her testing.  Therefore, it is important to remain in touch with cancer genetics in the future so that we can continue to offer Amy Fisher the most up to date genetic testing.   ADDITIONAL GENETIC TESTING: We discussed with Amy Fisher that her genetic testing was fairly extensive.  If there are genes identified to increase cancer risk that can be analyzed in the future, we would be happy to discuss and coordinate this testing at that time.    CANCER SCREENING RECOMMENDATIONS: Amy Fisher test result is considered negative (normal).  This means  that we have not identified a hereditary cause for her  personal and family history of cancer at this time. Most cancers happen by chance and this negative test suggests that her cancer may fall into this category.    While reassuring, this does not definitively rule out a hereditary predisposition to cancer. It is still possible that there could be genetic mutations that are undetectable by current technology. There could be genetic mutations in genes that have not been tested or identified to increase cancer risk.  Therefore, it is recommended she continue to follow the cancer management and screening guidelines provided by her oncology and primary healthcare provider.   An individual's cancer risk and medical management are not determined by genetic test results alone. Overall cancer risk assessment incorporates additional factors, including personal medical history, family history, and any available genetic information that may result in a personalized plan for cancer prevention and surveillance.  RECOMMENDATIONS FOR FAMILY MEMBERS:  Relatives in this family might be at some increased risk of developing cancer, over the general population risk, simply due to the family history of cancer.  We recommended female relatives in this family have a yearly mammogram beginning at age 5, or 7 years younger than the earliest onset of cancer, an annual clinical breast exam, and perform monthly breast  self-exams. Female relatives in this family should also have a gynecological exam as recommended by their primary provider. All family members should have a colonoscopy by age 70, or as directed by their physicians.  FOLLOW-UP: Lastly, we discussed with Amy Fisher that cancer genetics is a rapidly advancing field and it is possible that new genetic tests will be appropriate for her and/or her family members in the future. We encouraged her to remain in contact with cancer genetics on an annual basis so we can update  her personal and family histories and let her know of advances in cancer genetics that may benefit this family.   Our contact number was provided. Amy Fisher questions were answered to her satisfaction, and she knows she is welcome to call us at anytime with additional questions or concerns.   Faith Rogue, MS, [facility] Genetic Counselor [email or web address] Phone: [phone]

## 2019-08-23 ENCOUNTER — Other Ambulatory Visit: Payer: 59

## 2019-08-26 ENCOUNTER — Other Ambulatory Visit: Payer: Self-pay

## 2019-08-26 NOTE — Progress Notes (Signed)
[id number] [address] [place] [address] Phone: [phone] Fax: [phone]       Your procedure is scheduled on Tuesday Feb. 9, 2021.  Report to [facility] Main Entrance "A" at 05:30 A.M., and check in at the Admitting office.  Call this number if you have problems the morning of surgery:  [phone]   Call [phone] if you have any questions prior to your surgery date Monday-Friday 8am-4pm    Remember:  Do not eat after midnight the night before your surgery  You may drink clear liquids until 04:30 AM the morning of your surgery.   Clear liquids allowed are: Water, Non-Citrus Juices (without pulp), Carbonated Beverages, Clear Tea, Black Coffee Only, and Gatorade  Please complete your PRE-SURGERY ENSURE that was provided to you by 04:30 AM the morning of surgery.  Please, if able, drink it in one setting. DO NOT SIP.    Take these medicines the morning of surgery with A SIP OF WATER:  albuterol (VENTOLIN HFA) - as needed   Please bring all inhalers with you the day of surgery.    7 days prior to surgery STOP taking any Aspirin (unless otherwise instructed by your surgeon), Aleve, Naproxen, Ibuprofen, Motrin, Advil, Goody's, BC's, all herbal medications, fish oil, and all vitamins.    The Morning of Surgery  Do not wear jewelry, make-up or nail polish.  Do not wear lotions, powders, perfumes, or deodorant  Do not shave 48 hours prior to surgery.   Do not bring valuables to the hospital.  [facility] is not responsible for any belongings or valuables.  If you are a smoker, DO NOT Smoke 24 hours prior to surgery  If you wear a CPAP at night please bring your mask the morning of surgery   Remember that you must have someone to transport you home after your surgery, and remain with you for 24 hours if you are discharged the same day.   Please bring cases for contacts, glasses, hearing aids, dentures or  bridgework because it cannot be worn into surgery.   Leave your suitcase in the car.  After surgery it may be brought to your room.  For patients admitted to the hospital, discharge time will be determined by your treatment team.  Patients discharged the day of surgery will not be allowed to drive home.    Special instructions:   [place]- Preparing For Surgery  Before surgery, you can play an important role. Because skin is not sterile, your skin needs to be as free of germs as possible. You can reduce the number of germs on your skin by washing with CHG (chlorahexidine gluconate) Soap before surgery.  CHG is an antiseptic cleaner which kills germs and bonds with the skin to continue killing germs even after washing.    Oral Hygiene is also important to reduce your risk of infection.  Remember - BRUSH YOUR TEETH THE MORNING OF SURGERY WITH YOUR REGULAR TOOTHPASTE  Please do not use if you have an allergy to CHG or antibacterial soaps. If your skin becomes reddened/irritated stop using the CHG.  Do not shave (including legs and underarms) for at least 48 hours prior to first CHG shower. It is OK to shave your face.  Please follow these instructions carefully.   1. Shower the NIGHT BEFORE SURGERY and the MORNING OF SURGERY with CHG Soap.   2. If you chose to wash your hair, wash  your hair first as usual with your normal shampoo.  3. After you shampoo, rinse your hair and body thoroughly to remove the shampoo.  4. Use CHG as you would any other liquid soap. You can apply CHG directly to the skin and wash gently with a scrungie or a clean washcloth.   5. Apply the CHG Soap to your body ONLY FROM THE NECK DOWN.  Do not use on open wounds or open sores. Avoid contact with your eyes, ears, mouth and genitals (private parts). Wash Face and genitals (private parts)  with your normal soap.   6. Wash thoroughly, paying special attention to the area where your surgery will be  performed.  7. Thoroughly rinse your body with warm water from the neck down.  8. DO NOT shower/wash with your normal soap after using and rinsing off the CHG Soap.  9. Pat yourself dry with a CLEAN TOWEL.  10. Wear CLEAN PAJAMAS to bed the night before surgery, wear comfortable clothes the morning of surgery  11. Place CLEAN SHEETS on your bed the night of your first shower and DO NOT SLEEP WITH PETS.    Day of Surgery:  Please shower the morning of surgery with the CHG soap Do not apply any deodorants/lotions. Please wear clean clothes to the hospital/surgery center.   Remember to brush your teeth WITH YOUR REGULAR TOOTHPASTE.   Please read over the following fact sheets that you were given.

## 2019-08-27 ENCOUNTER — Encounter (HOSPITAL_COMMUNITY): Payer: Self-pay

## 2019-08-27 ENCOUNTER — Encounter: Payer: Self-pay | Admitting: Obstetrics and Gynecology

## 2019-08-27 ENCOUNTER — Encounter (HOSPITAL_COMMUNITY)
Admission: RE | Admit: 2019-08-27 | Discharge: 2019-08-27 | Disposition: A | Discharge status: Home / Self Care | Payer: 59 | Source: Ambulatory Visit | Attending: Surgery | Admitting: Surgery

## 2019-08-27 ENCOUNTER — Other Ambulatory Visit (HOSPITAL_COMMUNITY)
Admission: RE | Admit: 2019-08-27 | Discharge: 2019-08-27 | Disposition: A | Discharge status: Home / Self Care | Payer: 59 | Source: Ambulatory Visit | Attending: Surgery | Admitting: Surgery

## 2019-08-27 ENCOUNTER — Ambulatory Visit (INDEPENDENT_AMBULATORY_CARE_PROVIDER_SITE_OTHER): Payer: 59 | Admitting: Obstetrics and Gynecology

## 2019-08-27 VITALS — BP 116/74

## 2019-08-27 DIAGNOSIS — Z20822 Contact with and (suspected) exposure to covid-19: Secondary | ICD-10-CM | POA: Diagnosis not present

## 2019-08-27 DIAGNOSIS — Z01818 Encounter for other preprocedural examination: Secondary | ICD-10-CM | POA: Insufficient documentation

## 2019-08-27 DIAGNOSIS — C50912 Malignant neoplasm of unspecified site of left female breast: Secondary | ICD-10-CM | POA: Diagnosis not present

## 2019-08-27 DIAGNOSIS — Z124 Encounter for screening for malignant neoplasm of cervix: Secondary | ICD-10-CM

## 2019-08-27 DIAGNOSIS — I10 Essential (primary) hypertension: Secondary | ICD-10-CM | POA: Diagnosis not present

## 2019-08-27 DIAGNOSIS — Z1151 Encounter for screening for human papillomavirus (HPV): Secondary | ICD-10-CM

## 2019-08-27 DIAGNOSIS — Z79899 Other long term (current) drug therapy: Secondary | ICD-10-CM | POA: Insufficient documentation

## 2019-08-27 DIAGNOSIS — R87615 Unsatisfactory cytologic smear of cervix: Secondary | ICD-10-CM

## 2019-08-27 HISTORY — DX: Unspecified asthma, uncomplicated: J45.909

## 2019-08-27 LAB — BASIC METABOLIC PANEL
Anion gap: 9 (ref 5–15)
BUN: 16 mg/dL (ref 6–20)
CO2: 24 mmol/L (ref 22–32)
Calcium: 10.2 mg/dL (ref 8.9–10.3)
Chloride: 110 mmol/L (ref 98–111)
Creatinine, Ser: 0.7 mg/dL (ref 0.44–1.00)
GFR calc Af Amer: >60 mL/min (ref >60)
GFR calc non Af Amer: >60 mL/min (ref >60)
Glucose, Bld: 73 mg/dL (ref 70–99)
Potassium: 3.8 mmol/L (ref 3.5–5.1)
Sodium: 143 mmol/L (ref 135–145)

## 2019-08-27 LAB — SARS CORONAVIRUS 2 (TAT 6-24 HRS): SARS Coronavirus 2: NEGATIVE

## 2019-08-27 LAB — CBC
HCT: 46.7 % — ABNORMAL HIGH (ref 36.0–46.0)
Hemoglobin: 14.7 g/dL (ref 12.0–15.0)
MCH: 30 pg (ref 26.0–34.0)
MCHC: 31.5 g/dL (ref 30.0–36.0)
MCV: 95.3 fL (ref 80.0–100.0)
Platelets: 172 10*3/uL (ref 150–400)
RBC: 4.9 MIL/uL (ref 3.87–5.11)
RDW: 12.2 % (ref 11.5–15.5)
WBC: 4 10*3/uL (ref 4.0–10.5)
nRBC: 0 % (ref 0.0–0.2)

## 2019-08-27 NOTE — Progress Notes (Addendum)
PCP - Dr. Orpah Melter Cardiologist - denies   Chest x-ray - N/A EKG - 08/27/19 Stress Test - denies  ECHO - denies Cardiac Cath - denies  Sleep Study - denies  Aspirin Instructions: Patient instructed to hold all Aspirin, NSAID's, herbal medications, fish oil and vitamins 7 days prior to surgery.   ERAS Protcol - clear liquids until 0430 DOS PRE-SURGERY Ensure or G2- ensure  COVID TEST- 08/27/19 at [facility]. Pt instructed to remain in their car. Educated on Transport planner until [employer].    Anesthesia review: review EKG  Patient denies shortness of breath, fever, cough and chest pain at PAT appointment   All instructions explained to the patient, with a verbal understanding of the material. Patient agrees to go over the instructions while at home for a better understanding. Patient also instructed to self quarantine after being tested for COVID-19. The opportunity to ask questions was provided.

## 2019-08-27 NOTE — Progress Notes (Signed)
   Amy Fisher  1961-01-22 [id number]  HPI The patient is a 59 y.o. G0P0 who presents today for a repeat Pap smear.  The result returned with insufficient cells after her last exam.  Past medical history,surgical history, problem list, medications, allergies, family history and social history were all reviewed and documented as reviewed in the EPIC chart.  ROS:  Feeling well. GYN ROS:  no abnormal bleeding, pelvic pain or discharge.  Physical Exam  BP 116/74   LMP 03/31/2015   General: Pleasant female, no acute distress, alert and oriented PELVIC EXAM: VULVA: normal appearing vulva with no masses, tenderness or lesions, VAGINA: normal appearing vagina with normal color and discharge, no lesions, CERVIX: normal appearing cervix without discharge or lesions  Assessment 59 yo G0P0 here for a Pap smear only exam  Plan Pap smear results will be communicated to the patient as soon as available.  Joseph Pierini MD 08/27/19

## 2019-08-30 ENCOUNTER — Ambulatory Visit
Admission: RE | Admit: 2019-08-30 | Discharge: 2019-08-30 | Disposition: A | Discharge status: Home / Self Care | Payer: 59 | Source: Ambulatory Visit | Attending: Surgery | Admitting: Surgery

## 2019-08-30 ENCOUNTER — Other Ambulatory Visit: Payer: Self-pay

## 2019-08-30 ENCOUNTER — Encounter (HOSPITAL_BASED_OUTPATIENT_CLINIC_OR_DEPARTMENT_OTHER): Payer: Self-pay | Admitting: Surgery

## 2019-08-30 DIAGNOSIS — C50512 Malignant neoplasm of lower-outer quadrant of left female breast: Secondary | ICD-10-CM

## 2019-08-30 NOTE — H&P (Signed)
Amy Fisher Documented: 08/30/2019 1:18 Fisher Location: [facility] Patient #: [id number] DOB: 08-13-1960 Single / Language: Amy Fisher / Race: White Female  History of Present Illness Amy Moores A. Shany Marinez Fisher; 08/30/2019 1:34 Fisher) Patient words: Patient presents today for follow-up of her stage I left breast cancer. Due to a medical emergency, her surgery was changed for me to see her since Dr. [name] will be unavailable for these foreseeable future. She comes in today for me to manage her discuss surgery since she wishes to proceed and is scheduled for seed placement this afternoon. We'll review her pathology, mammogram, magnetic resonance imaging, genetics and pathology with markers. I reviewed her plan is still wished proceed with left proceed lumpectomy and sentinel lymph node mapping                he patient is a 59 year old female who presents with a complaint of breast cancer.  The PCP is Dr. Orpah Fisher I took care of her mother, Amy Fisher. She is by herself.  [The Covid-19 virus has disrupted normal medical care in [place] and across the nation. We have sometimes had to alter normal surgical/medical care to limit this epidemic and we have explained these changes to the patient.]  Her last mammogram was about 4 years ago. She had felt a BB nodule in her left breast for some 8 years ago. She thinks this is unchanged. She is not on hormonal treatment. Her mother and her mother's aunt have had breast cancer. She is also anxious. She has some numbness in her hands. She is worried about "eaten up " with cancer. I tried to reassure her that right now although no about is that she has stage I breast cancer.  Mammograms: [facility] - 07/14/2019 - Targeted ultrasound is performed, showing an irregular mass with irregular margins in the 4 o'clock location of the LEFT breast 2 centimeters from the nipple. Mass measures 1.5 x 1.1 x 1.1  centimeters. An adjacent small satellite nodule is 0.4 x 0.3 x 0.3 centimeters. The smaller mass is 3 millimeters from the larger mass. Measured together, these are 2.0 centimeters in diameter. Biopsy: Left breast biopsy, 4 o'clock on 07/22/2019 ([id number]) Invasive lobular carcinoma, grade 2, ER - 100%, PR - 95%, Ki67 - 2%, Her2Neu - equivocal Family history of breast or ovarian cancer: Mother had breast cancer Amy Fisher) On hormone therapy: No  I discussed the options for breast cancer treatment with the patient. I discussed with the patient the multidisciplinary approach to breast cancer, which includes medical oncology and radiation oncology. I discussed the surgical options of lumpectomy vs. mastectomy. If mastectomy, there is the possibility of reconstruction. I discussed the options of lymph node biopsy. The treatment plan depends on the pathologic staging of the tumor and the patient's personal wishes. The risks of surgery include, but are not limited to, bleeding, infection, the need for further surgery, and nerve injury. The patient has been given literature on the treatment of breast cancer.  Plan: 1. Medical and oncology consultation, 2. MRI of breast, 3. Left breast lumpectomy and left axillary SLNBx, 4. Genetics consultation  Past Medical History: 1. Left breast cancer Left breast biopsy, 4 o'clock on 07/22/2019 ([id number]) Invasive lobular carcinoma, grade 2, ER - 100%, PR - 95%, Ki67 - 2%, Her2Neu - equivocal 2. Colonoscopy - 2016 3. HTN 4. Exercise induced coughing - maybe not asthma.  Social History: Divorced She gave up a daughter at birth - but she  has made contact with her. 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 She is going [place] out [place] from 1[place]10 - 08/04/2019.  The patient is a 59 year old female.   Problem List/Past Medical Amy Fisher, CMA; 08/30/2019 1:19 Fisher) MALIGNANT NEOPLASM OF LEFT  BREAST, STAGE 1, ESTROGEN RECEPTOR POSITIVE (C50.912) Left breast biopsy, 4 o'clock on 07/22/2019 ([id number]) Invasive lobular carcinoma, grade 2, ER - 100%, PR - 95%, Ki67 - 2%, Her2Neu - equivocal  Past Surgical History Amy Fisher, CMA; 08/30/2019 1:19 Fisher) No pertinent past surgical history  Diagnostic Studies History Amy Fisher, CMA; 08/30/2019 1:19 Fisher) Colonoscopy 5-10 years ago Mammogram within last year Pap Smear 1-5 years ago  Allergies Amy Fisher, CMA; 08/30/2019 1:19 Fisher) No Known Drug Allergies [07/29/2019]:  Medication History Amy Fisher, [place]; 08/30/2019 1:19 Fisher) Benazepril HCl (10MG Tablet, Oral) Active. Vitamin B12 (100MCG Tablet, Oral) Active. Multi-Vitamin (Oral) Active. Vitamin C (100MG Tablet, Oral) Active. Medications Reconciled  Social History Amy Fisher, CMA; 08/30/2019 1:19 Fisher) Alcohol use Moderate alcohol use. Caffeine use Carbonated beverages. No drug use  Family History Amy Fisher, CMA; 08/30/2019 1:19 Fisher) Breast Cancer Mother.  Pregnancy / Birth History Amy Fisher, CMA; 08/30/2019 1:19 Fisher) Age at menarche 39 years. Age of menopause 50-55 Gravida 1 Maternal age 17-20 Para 1  Other Problems Amy Fisher, CMA; 08/30/2019 1:19 Fisher) High blood pressure    Vitals (Amy Fisher CMA; 08/30/2019 1:19 Fisher) 08/30/2019 1:19 Fisher Height: 65in        Physical Exam (Amy Fisher; 08/30/2019 1:34 Fisher)  General Mental Status-Alert. General Appearance-Consistent with stated age. Hydration-Well hydrated. Voice-Normal.    Assessment & Plan (Amy Fisher)  MALIGNANT NEOPLASM OF LEFT BREAST, STAGE 1, ESTROGEN RECEPTOR POSITIVE (C50.912) Story: Left breast biopsy, 4 o'clock on 07/22/2019 ([id number]) Invasive lobular carcinoma, grade 2, ER - 100%, PR - 95%, Ki67 - 2%, Her2Neu - equivocal Impression: Plan:  1. Medical and oncology  consultation  2. MRI of breast  3. Left breast lumpectomy and left axillary SLNBx  4. Genetics consultation  5. Need Her2Nue results   Proceed with left breast she localized lumpectomy with sentinel lymph node mapping. I've explained procedure review Dr. Pollie Friar notes and the rationale for doing so. She is counseled for this plan and will proceed. I offered her waiting for Dr. [name] returned but she would like to go ahead and stay on schedule.

## 2019-08-30 NOTE — Anesthesia Preprocedure Evaluation (Addendum)
Anesthesia Evaluation  Patient identified by MRN, date of birth, ID band Patient awake    Reviewed: Allergy & Precautions, NPO status , Patient's Chart, lab work & pertinent test results  Airway Mallampati: II  TM Distance: >3 FB Neck ROM: Full    Dental no notable dental hx. (+) Teeth Intact, Dental Advisory Given   Pulmonary asthma ,    Pulmonary exam normal breath sounds clear to auscultation       Cardiovascular hypertension, Pt. on medications negative cardio ROS Normal cardiovascular exam Rhythm:Regular Rate:Normal     Neuro/Psych negative neurological ROS  negative psych ROS   GI/Hepatic negative GI ROS, Neg liver ROS,   Endo/Other  negative endocrine ROS  Renal/GU negative Renal ROS  negative genitourinary   Musculoskeletal negative musculoskeletal ROS (+)   Abdominal   Peds  Hematology negative hematology ROS (+)   Anesthesia Other Findings Left breast cancer  Reproductive/Obstetrics                           Anesthesia Physical Anesthesia Plan  ASA: II  Anesthesia Plan: General and Regional   Post-op Pain Management:  Regional for Post-op pain   Induction: Intravenous  PONV Risk Score and Plan: 3 and Ondansetron, Dexamethasone and Midazolam  Airway Management Planned: LMA  Additional Equipment:   Intra-op Plan:   Post-operative Plan: Extubation in OR  Informed Consent: I have reviewed the patients History and Physical, chart, labs and discussed the procedure including the risks, benefits and alternatives for the proposed anesthesia with the patient or authorized representative who has indicated his/her understanding and acceptance.     Dental advisory given  Plan Discussed with: CRNA  Anesthesia Plan Comments:        Anesthesia Quick Evaluation

## 2019-08-30 NOTE — Progress Notes (Addendum)
Anesthesia Chart Review:  Case: [id number] Date/Time: 08/31/19 0730   Procedure: LEFT BREAST LUMPECTOMY WITH RADIOACTIVE SEED AND LEFT AXILLARY SENTINEL LYMPH NODE BIOPSY (Left Breast)   Anesthesia type: General   Pre-op diagnosis: left breast cancer   Location: [facility] ROOM 6 / [place]   Surgeons: Erroll Luna, MD     RSL scheduled for 08/30/19 at 1:15 PM.   DISCUSSION: Patient is a 59 year old female scheduled for the above procedure.  Surgery was initially scheduled with Alphonsa Overall, MD, but is now with Erroll Luna, MD.  History includes never smoker, HTN, left breast cancer (07/22/19: invasive mammary carcinoma), asthma, fibroids, cervical dysplasia (2008, 2009).  Patient's pre-operative EKG showed inferior and anterolateral T wave inversion.  She denied previous EKG or cardiac testing, so unable to assess if this is a change or just her known baseline. No known cardiac history, although she was started on antihypertensive medication within the past 1-2 years. 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 (Reported she went to donate blood and was denied because BP ~ 170/110 at that time, but currently BP controlled on ACE-inhibitor.)  I called her to inquire about activity tolerance and any CV symptoms. She had already reviewed EKG interpretation in My Chart, so she did have some questions which I did my best to answer. She denied any chest pain, and is actually quite active--exercises nearly every day for at least 45 minutes (including running, treadmill, weights, [employer]) and even runs half marathons, although not real recent.  She does note that for ~ 2 years, she has developed somewhat of a dry cough at the beginning of her exercise, that is a nuisance but does not require her to stop and rest. After several coughs, she feels her throat "clears" and she continues to exercise getting HR up to 160's. She has also noticed that since last summer when she feels anxious, is  wearing a mask, or with prolonged talking she will feel like she needs to take an intermittent deep breath. In general though, does not feel short of breath. No orthopnea. No change in energy level. She does not notice wheezing. She has talked to her PCP about her cough and was given a trial of albuterol MDI prior to exercise but has not noticed much of a difference. She denied edema, murmur, syncope/presyncope. Her grandfather may have had a MI later in life, but otherwise no known family history of heart disease.  Father is deceased. Mother is alive at 43 years old.   Her activity level is well above 4 METS as outlined above. She does report a cough initially with exercise, but it does not limit her activity and has been going on for about 2 years. There was question of exercise induced asthma, but she hasn't noticed much difference with albuterol. She was started on ACE-inhibitor within the past two years, so I did mention that ACE-inhibitors can be associated with cough, although I am more aware of these being associated with more of a chronic cough. There are other possible etiologies as well, such as reflux.  She was advised to follow-up with her PCP in the future for on-going management of cough. Also encouraged her to discuss any additional questions regarding EKG with Dr. Olen Pel. Discussed patient's conversation findings, EKG, history and planned procedure with anesthesiologist Nolon Nations, MD. Given her excellent activity tolerance without exertional chest pain or dyspnea, then it is anticipated that she can proceed with planned procedure if no acute  changes.  I sent Dr. Brantley Stage an update.   VS: BP 119/64   Pulse 64   Temp 36.8 C (Oral)   Resp 18   Ht 5\' 5"  (1.651 m)   Wt 56.9 kg   LMP 03/31/2015   SpO2 100%   BMI 20.88 kg/m    PROVIDERS: Orpah Melter, MD is PCP  Joseph Pierini, MD is GYN Magrinat, Sarajane Jews, MD is Edson Snowball, MD is RAD-ONC   LABS: Labs reviewed:  Acceptable for surgery. (all labs ordered are listed, but only abnormal results are displayed)  Labs Reviewed  CBC - Abnormal; Notable for the following components:      Result Value   HCT 46.7 (*)    All other components within normal limits  BASIC METABOLIC PANEL    EKG: EKG 08/27/19: Normal sinus rhythm Possible Left atrial enlargement ST & T wave abnormality, consider inferior ischemia ST & T wave abnormality, consider anterolateral ischemia No old tracing to compare Confirmed by Minus Breeding [id number]) on 08/27/2019 8:28:47 PM - No comparison EKG in [facility], [facility], or at PCP office   CV: N/A  Past Medical History:  Diagnosis Date  . Asthma   . Cancer ([place])    Breast  . Cervical dysplasia    lgsil 2008, ascus 2009, normal Pap smears afterwards  . Fibroid   . Hypertension     Past Surgical History:  Procedure Laterality Date  . BREAST SURGERY     Breast Biopsy  . IUD REMOVAL  07/2017   Mirena  . PILONIDAL CYST EXCISION      MEDICATIONS: . albuterol (VENTOLIN HFA) 108 (90 Base) MCG/ACT inhaler  . ascorbic acid (VITAMIN C) 500 MG tablet  . benazepril (LOTENSIN) 10 MG tablet  . cholecalciferol (VITAMIN D3) 25 MCG (1000 UNIT) tablet  . ibuprofen (ADVIL) 200 MG tablet  . Melatonin 10 MG TABS  . Multiple Vitamin (MULTIVITAMIN) tablet  . vitamin B-12 (CYANOCOBALAMIN) 1000 MCG tablet   No current facility-administered medications for this encounter.    Myra Gianotti, PA-C Surgical Short Stay/Anesthesiology [facility] Phone [phone] [facility] Phone [phone] 08/30/2019 12:01 PM

## 2019-08-30 NOTE — H&P (View-Only) (Signed)
Amy Fisher Documented: 08/30/2019 1:18 PM Location: [facility] Patient #: [id number] DOB: 08-13-1960 Single / Language: Amy Fisher / Race: White Female  History of Present Illness [name] A. [name] MD; 08/30/2019 1:34 PM) Patient words: Patient presents today for follow-up of her stage I left breast cancer. Due to a medical emergency, her surgery was changed for me to see her since Dr. [name] will be unavailable for these foreseeable future. She comes in today for me to manage her discuss surgery since she wishes to proceed and is scheduled for seed placement this afternoon. We'll review her pathology, mammogram, magnetic resonance imaging, genetics and pathology with markers. I reviewed her plan is still wished proceed with left proceed lumpectomy and sentinel lymph node mapping                he patient is a 59 year old female who presents with a complaint of breast cancer.  The PCP is Dr. Orpah Melter I took care of her mother, Amy Fisher. She is by herself.  [The Covid-19 virus has disrupted normal medical care in [place] and across the nation. We have sometimes had to alter normal surgical/medical care to limit this epidemic and we have explained these changes to the patient.]  Her last mammogram was about 4 years ago. She had felt a BB nodule in her left breast for some 8 years ago. She thinks this is unchanged. She is not on hormonal treatment. Her mother and her mother's aunt have had breast cancer. She is also anxious. She has some numbness in her hands. She is worried about "eaten up " with cancer. I tried to reassure her that right now although no about is that she has stage I breast cancer.  Mammograms: [facility] - 07/14/2019 - Targeted ultrasound is performed, showing an irregular mass with irregular margins in the 4 o'clock location of the LEFT breast 2 centimeters from the nipple. Mass measures 1.5 x 1.1 x 1.1  centimeters. An adjacent small satellite nodule is 0.4 x 0.3 x 0.3 centimeters. The smaller mass is 3 millimeters from the larger mass. Measured together, these are 2.0 centimeters in diameter. Biopsy: Left breast biopsy, 4 o'clock on 07/22/2019 ([id number]) Invasive lobular carcinoma, grade 2, ER - 100%, PR - 95%, Ki67 - 2%, Her2Neu - equivocal Family history of breast or ovarian cancer: Mother had breast cancer Amy Fisher) On hormone therapy: No  I discussed the options for breast cancer treatment with the patient. I discussed with the patient the multidisciplinary approach to breast cancer, which includes medical oncology and radiation oncology. I discussed the surgical options of lumpectomy vs. mastectomy. If mastectomy, there is the possibility of reconstruction. I discussed the options of lymph node biopsy. The treatment plan depends on the pathologic staging of the tumor and the patient's personal wishes. The risks of surgery include, but are not limited to, bleeding, infection, the need for further surgery, and nerve injury. The patient has been given literature on the treatment of breast cancer.  Plan: 1. Medical and oncology consultation, 2. MRI of breast, 3. Left breast lumpectomy and left axillary SLNBx, 4. Genetics consultation  Past Medical History: 1. Left breast cancer Left breast biopsy, 4 o'clock on 07/22/2019 ([id number]) Invasive lobular carcinoma, grade 2, ER - 100%, PR - 95%, Ki67 - 2%, Her2Neu - equivocal 2. Colonoscopy - 2016 3. HTN 4. Exercise induced coughing - maybe not asthma.  Social History: Divorced She gave up a daughter at birth - but she  has made contact with her. 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 She is going [place] out [place] from 1[place]10 - 08/04/2019.  The patient is a 59 year old female.   Problem List/Past Medical [name], CMA; 08/30/2019 1:19 PM) MALIGNANT NEOPLASM OF LEFT  BREAST, STAGE 1, ESTROGEN RECEPTOR POSITIVE (C50.912) Left breast biopsy, 4 o'clock on 07/22/2019 ([id number]) Invasive lobular carcinoma, grade 2, ER - 100%, PR - 95%, Ki67 - 2%, Her2Neu - equivocal  Past Surgical History [name], CMA; 08/30/2019 1:19 PM) No pertinent past surgical history  Diagnostic Studies History [name], CMA; 08/30/2019 1:19 PM) Colonoscopy 5-10 years ago Mammogram within last year Pap Smear 1-5 years ago  Allergies [name], CMA; 08/30/2019 1:19 PM) No Known Drug Allergies [07/29/2019]:  Medication History [name], [place]; 08/30/2019 1:19 PM) Benazepril HCl (10MG Tablet, Oral) Active. Vitamin B12 (100MCG Tablet, Oral) Active. Multi-Vitamin (Oral) Active. Vitamin C (100MG Tablet, Oral) Active. Medications Reconciled  Social History [name], CMA; 08/30/2019 1:19 PM) Alcohol use Moderate alcohol use. Caffeine use Carbonated beverages. No drug use  Family History [name], CMA; 08/30/2019 1:19 PM) Breast Cancer Mother.  Pregnancy / Birth History [name], CMA; 08/30/2019 1:19 PM) Age at menarche 39 years. Age of menopause 50-55 Gravida 1 Maternal age 17-20 Para 1  Other Problems [name], CMA; 08/30/2019 1:19 PM) High blood pressure    Vitals ([name] CMA; 08/30/2019 1:19 PM) 08/30/2019 1:19 PM Height: 65in        Physical Exam (Monserat Prestigiacomo A. Kathleen Likins MD; 08/30/2019 1:34 PM)  General Mental Status-Alert. General Appearance-Consistent with stated age. Hydration-Well hydrated. Voice-Normal.    Assessment & Plan (Jianni Batten A. Garreth Burnsworth MD; 08/30/2019 1:35 PM)  MALIGNANT NEOPLASM OF LEFT BREAST, STAGE 1, ESTROGEN RECEPTOR POSITIVE (C50.912) Story: Left breast biopsy, 4 o'clock on 07/22/2019 ([id number]) Invasive lobular carcinoma, grade 2, ER - 100%, PR - 95%, Ki67 - 2%, Her2Neu - equivocal Impression: Plan:  1. Medical and oncology  consultation  2. MRI of breast  3. Left breast lumpectomy and left axillary SLNBx  4. Genetics consultation  5. Need Her2Nue results   Proceed with left breast she localized lumpectomy with sentinel lymph node mapping. I've explained procedure review Dr. Pollie Friar notes and the rationale for doing so. She is counseled for this plan and will proceed. I offered her waiting for Dr. [name] returned but she would like to go ahead and stay on schedule.

## 2019-08-31 ENCOUNTER — Ambulatory Visit (HOSPITAL_COMMUNITY)
Admission: RE | Admit: 2019-08-31 | Discharge: 2019-08-31 | Disposition: A | Discharge status: Home / Self Care | Payer: 59 | Source: Ambulatory Visit | Attending: Surgery | Admitting: Surgery

## 2019-08-31 ENCOUNTER — Ambulatory Visit
Admission: RE | Admit: 2019-08-31 | Discharge: 2019-08-31 | Disposition: A | Discharge status: Home / Self Care | Payer: 59 | Source: Ambulatory Visit | Attending: Surgery | Admitting: Surgery

## 2019-08-31 ENCOUNTER — Other Ambulatory Visit: Payer: Self-pay

## 2019-08-31 ENCOUNTER — Ambulatory Visit (HOSPITAL_COMMUNITY)
Admission: RE | Admit: 2019-08-31 | Discharge: 2019-08-31 | Disposition: A | Discharge status: Home / Self Care | Payer: 59 | Attending: Surgery | Admitting: Surgery

## 2019-08-31 ENCOUNTER — Encounter (HOSPITAL_BASED_OUTPATIENT_CLINIC_OR_DEPARTMENT_OTHER)
Admission: RE | Disposition: A | Discharge status: Home / Self Care | Payer: Self-pay | Source: Home / Self Care | Attending: Surgery

## 2019-08-31 ENCOUNTER — Encounter (HOSPITAL_BASED_OUTPATIENT_CLINIC_OR_DEPARTMENT_OTHER): Payer: Self-pay | Admitting: Surgery

## 2019-08-31 ENCOUNTER — Ambulatory Visit (HOSPITAL_COMMUNITY): Payer: 59

## 2019-08-31 ENCOUNTER — Ambulatory Visit (HOSPITAL_BASED_OUTPATIENT_CLINIC_OR_DEPARTMENT_OTHER): Payer: 59 | Admitting: Anesthesiology

## 2019-08-31 ENCOUNTER — Ambulatory Visit (HOSPITAL_BASED_OUTPATIENT_CLINIC_OR_DEPARTMENT_OTHER): Payer: 59 | Admitting: Vascular Surgery

## 2019-08-31 DIAGNOSIS — Z79899 Other long term (current) drug therapy: Secondary | ICD-10-CM | POA: Diagnosis not present

## 2019-08-31 DIAGNOSIS — C50512 Malignant neoplasm of lower-outer quadrant of left female breast: Secondary | ICD-10-CM

## 2019-08-31 DIAGNOSIS — I10 Essential (primary) hypertension: Secondary | ICD-10-CM | POA: Insufficient documentation

## 2019-08-31 DIAGNOSIS — Z170 Estrogen receptor positive status [ER+]: Secondary | ICD-10-CM | POA: Insufficient documentation

## 2019-08-31 DIAGNOSIS — Z803 Family history of malignant neoplasm of breast: Secondary | ICD-10-CM | POA: Diagnosis not present

## 2019-08-31 HISTORY — PX: BREAST LUMPECTOMY WITH RADIOACTIVE SEED AND SENTINEL LYMPH NODE BIOPSY: SHX6550

## 2019-08-31 SURGERY — BREAST LUMPECTOMY WITH RADIOACTIVE SEED AND SENTINEL LYMPH NODE BIOPSY
Anesthesia: Regional | Site: Breast | Laterality: Left

## 2019-08-31 MED ORDER — ACETAMINOPHEN 500 MG PO TABS
ORAL_TABLET | ORAL | Status: AC
  Filled 2019-08-31: qty 1

## 2019-08-31 MED ORDER — MIDAZOLAM HCL 2 MG/2ML IJ SOLN
INTRAMUSCULAR | Status: AC
  Filled 2019-08-31: qty 2

## 2019-08-31 MED ORDER — CHLORHEXIDINE GLUCONATE CLOTH 2 % EX PADS: 6.0000 | MEDICATED_PAD | Freq: Once | CUTANEOUS | Status: DC

## 2019-08-31 MED ORDER — PROPOFOL 10 MG/ML IV BOLUS
INTRAVENOUS | Status: DC | PRN
  Administered 2019-08-31: 200 mg via INTRAVENOUS

## 2019-08-31 MED ORDER — CEFAZOLIN SODIUM-DEXTROSE 2-4 GM/100ML-% IV SOLN
2.0000 g | INTRAVENOUS | Status: AC
  Administered 2019-08-31: 2 g via INTRAVENOUS

## 2019-08-31 MED ORDER — HYDROCODONE-ACETAMINOPHEN 5-325 MG PO TABS: 1.0000 | ORAL_TABLET | Freq: Four times a day (QID) | ORAL | 0 refills | Status: DC | PRN

## 2019-08-31 MED ORDER — TECHNETIUM TC 99M SULFUR COLLOID FILTERED
1.0000 | Freq: Once | INTRAVENOUS | Status: AC | PRN
  Administered 2019-08-31: 08:00:00 1 via INTRADERMAL

## 2019-08-31 MED ORDER — DEXAMETHASONE SODIUM PHOSPHATE 10 MG/ML IJ SOLN
INTRAMUSCULAR | Status: AC
  Filled 2019-08-31: qty 1

## 2019-08-31 MED ORDER — LIDOCAINE 2% (20 MG/ML) 5 ML SYRINGE
INTRAMUSCULAR | Status: AC
  Filled 2019-08-31: qty 5

## 2019-08-31 MED ORDER — LACTATED RINGERS IV SOLN: INTRAVENOUS | Status: DC

## 2019-08-31 MED ORDER — MIDAZOLAM HCL 2 MG/2ML IJ SOLN
1.0000 mg | INTRAMUSCULAR | Status: DC | PRN
  Administered 2019-08-31: 2 mg via INTRAVENOUS

## 2019-08-31 MED ORDER — BUPIVACAINE-EPINEPHRINE (PF) 0.25% -1:200000 IJ SOLN
INTRAMUSCULAR | Status: DC | PRN
  Administered 2019-08-31: 11 mL

## 2019-08-31 MED ORDER — ONDANSETRON HCL 4 MG/2ML IJ SOLN
INTRAMUSCULAR | Status: DC | PRN
  Administered 2019-08-31: 4 mg via INTRAVENOUS

## 2019-08-31 MED ORDER — FENTANYL CITRATE (PF) 100 MCG/2ML IJ SOLN
INTRAMUSCULAR | Status: AC
  Filled 2019-08-31: qty 2

## 2019-08-31 MED ORDER — FENTANYL CITRATE (PF) 100 MCG/2ML IJ SOLN
50.0000 ug | INTRAMUSCULAR | Status: AC | PRN
  Administered 2019-08-31: 25 ug via INTRAVENOUS
  Administered 2019-08-31: 07:00:00 100 ug via INTRAVENOUS
  Administered 2019-08-31: 25 ug via INTRAVENOUS

## 2019-08-31 MED ORDER — FENTANYL CITRATE (PF) 100 MCG/2ML IJ SOLN: 25.0000 ug | INTRAMUSCULAR | Status: DC | PRN

## 2019-08-31 MED ORDER — ROPIVACAINE HCL 5 MG/ML IJ SOLN
INTRAMUSCULAR | Status: DC | PRN
  Administered 2019-08-31: 30 mL via PERINEURAL

## 2019-08-31 MED ORDER — DEXAMETHASONE SODIUM PHOSPHATE 10 MG/ML IJ SOLN
INTRAMUSCULAR | Status: DC | PRN
  Administered 2019-08-31: 5 mg

## 2019-08-31 MED ORDER — ACETAMINOPHEN 500 MG PO TABS
1000.0000 mg | ORAL_TABLET | ORAL | Status: AC
  Administered 2019-08-31: 1000 mg via ORAL

## 2019-08-31 MED ORDER — DEXAMETHASONE SODIUM PHOSPHATE 4 MG/ML IJ SOLN
INTRAMUSCULAR | Status: DC | PRN
  Administered 2019-08-31: 5 mg via INTRAVENOUS

## 2019-08-31 MED ORDER — CEFAZOLIN SODIUM-DEXTROSE 2-4 GM/100ML-% IV SOLN
INTRAVENOUS | Status: AC
  Filled 2019-08-31: qty 100

## 2019-08-31 MED ORDER — PROPOFOL 500 MG/50ML IV EMUL
INTRAVENOUS | Status: AC
  Filled 2019-08-31: qty 50

## 2019-08-31 MED ORDER — ONDANSETRON HCL 4 MG/2ML IJ SOLN
INTRAMUSCULAR | Status: AC
  Filled 2019-08-31: qty 2

## 2019-08-31 SURGICAL SUPPLY — 47 items
APPLIER CLIP 9.375 MED OPEN (MISCELLANEOUS) ×3
BINDER BREAST LRG (GAUZE/BANDAGES/DRESSINGS) IMPLANT
BINDER BREAST MEDIUM (GAUZE/BANDAGES/DRESSINGS) ×3 IMPLANT
BINDER BREAST XLRG (GAUZE/BANDAGES/DRESSINGS) IMPLANT
BINDER BREAST XXLRG (GAUZE/BANDAGES/DRESSINGS) IMPLANT
BLADE SURG 15 STRL LF DISP TIS (BLADE) ×1 IMPLANT
BLADE SURG 15 STRL SS (BLADE) ×2
CANISTER SUC SOCK COL 7IN (MISCELLANEOUS) IMPLANT
CANISTER SUCT 1200ML W/VALVE (MISCELLANEOUS) ×3 IMPLANT
CHLORAPREP W/TINT 26 (MISCELLANEOUS) ×3 IMPLANT
CLIP APPLIE 9.375 MED OPEN (MISCELLANEOUS) ×1 IMPLANT
COVER BACK TABLE 60X90IN (DRAPES) ×3 IMPLANT
COVER MAYO STAND STRL (DRAPES) ×3 IMPLANT
COVER PROBE W GEL 5X96 (DRAPES) ×3 IMPLANT
COVER WAND RF STERILE (DRAPES) IMPLANT
DECANTER SPIKE VIAL GLASS SM (MISCELLANEOUS) IMPLANT
DERMABOND ADVANCED (GAUZE/BANDAGES/DRESSINGS) ×2
DERMABOND ADVANCED .7 DNX12 (GAUZE/BANDAGES/DRESSINGS) ×1 IMPLANT
DRAPE LAPAROSCOPIC ABDOMINAL (DRAPES) ×3 IMPLANT
DRAPE UTILITY XL STRL (DRAPES) ×3 IMPLANT
ELECT COATED BLADE 2.86 ST (ELECTRODE) ×3 IMPLANT
ELECT REM PT RETURN 9FT ADLT (ELECTROSURGICAL) ×3
ELECTRODE REM PT RTRN 9FT ADLT (ELECTROSURGICAL) ×1 IMPLANT
GLOVE BIOGEL PI IND STRL 8 (GLOVE) ×1 IMPLANT
GLOVE BIOGEL PI INDICATOR 8 (GLOVE) ×2
GLOVE ECLIPSE 8.0 STRL XLNG CF (GLOVE) ×3 IMPLANT
GOWN STRL REUS W/ TWL LRG LVL3 (GOWN DISPOSABLE) ×2 IMPLANT
GOWN STRL REUS W/TWL LRG LVL3 (GOWN DISPOSABLE) ×4
HEMOSTAT ARISTA ABSORB 3G PWDR (HEMOSTASIS) ×3 IMPLANT
HEMOSTAT SNOW SURGICEL 2X4 (HEMOSTASIS) IMPLANT
KIT MARKER MARGIN INK (KITS) ×3 IMPLANT
NDL SAFETY ECLIPSE 18X1.5 (NEEDLE) IMPLANT
NEEDLE HYPO 18GX1.5 SHARP (NEEDLE)
NEEDLE HYPO 25X1 1.5 SAFETY (NEEDLE) ×3 IMPLANT
NS IRRIG 1000ML POUR BTL (IV SOLUTION) ×3 IMPLANT
PACK BASIN DAY SURGERY FS (CUSTOM PROCEDURE TRAY) ×3 IMPLANT
PENCIL SMOKE EVACUATOR (MISCELLANEOUS) ×3 IMPLANT
SLEEVE SCD COMPRESS KNEE MED (MISCELLANEOUS) ×3 IMPLANT
SPONGE LAP 4X18 RFD (DISPOSABLE) ×3 IMPLANT
SUT MNCRL AB 4-0 PS2 18 (SUTURE) ×6 IMPLANT
SUT VICRYL 3-0 CR8 SH (SUTURE) ×3 IMPLANT
SYR CONTROL 10ML LL (SYRINGE) ×3 IMPLANT
TOWEL GREEN STERILE FF (TOWEL DISPOSABLE) ×3 IMPLANT
TRAY FAXITRON CT DISP (TRAY / TRAY PROCEDURE) ×3 IMPLANT
TUBE CONNECTING 20'X1/4 (TUBING) ×1
TUBE CONNECTING 20X1/4 (TUBING) ×2 IMPLANT
YANKAUER SUCT BULB TIP NO VENT (SUCTIONS) ×3 IMPLANT

## 2019-08-31 NOTE — Anesthesia Postprocedure Evaluation (Signed)
Anesthesia Post Note  Patient: Amy Fisher  Procedure(s) Performed: LEFT BREAST LUMPECTOMY WITH RADIOACTIVE SEED AND LEFT AXILLARY SENTINEL LYMPH NODE BIOPSY (Left Breast)     Patient location during evaluation: PACU Anesthesia Type: Regional and General Level of consciousness: awake and alert Pain management: pain level controlled Vital Signs Assessment: post-procedure vital signs reviewed and stable Respiratory status: spontaneous breathing, nonlabored ventilation, respiratory function stable and patient connected to nasal cannula oxygen Cardiovascular status: blood pressure returned to baseline and stable Postop Assessment: no apparent nausea or vomiting Anesthetic complications: no    Last Vitals:  Vitals:   08/31/19 0930 08/31/19 1000  BP:  (!) 144/80  Pulse: 72 73  Resp: 12 14  Temp:  36.9 C  SpO2: 100% 100%    Last Pain:  Vitals:   08/31/19 1000  TempSrc:   PainSc: 0-No pain                 Amy Fisher Amy Fisher

## 2019-08-31 NOTE — Anesthesia Procedure Notes (Signed)
Anesthesia Regional Block: Pectoralis block   Pre-Anesthetic Checklist: ,, timeout performed, Correct Patient, Correct Site, Correct Laterality, Correct Procedure, Correct Position, site marked, Risks and benefits discussed,  Surgical consent,  Pre-op evaluation,  At surgeon's request and post-op pain management  Laterality: Left  Prep: Maximum Sterile Barrier Precautions used, chloraprep       Needles:  Injection technique: Single-shot  Needle Type: Echogenic Stimulator Needle     Needle Length: 9cm  Needle Gauge: 22     Additional Needles:   Procedures:,,,, ultrasound used (permanent image in chart),,,,  Narrative:  Start time: 08/31/2019 7:01 AM End time: 08/31/2019 7:11 AM Injection made incrementally with aspirations every 5 mL.  Performed by: Personally  Anesthesiologist: Freddrick March, MD  Additional Notes: Monitors applied. No increased pain on injection. No increased resistance to injection. Injection made in 5cc increments. Good needle visualization. Patient tolerated procedure well.

## 2019-08-31 NOTE — Op Note (Signed)
Preoperative diagnosis: Stage I left breast cancer lower outer quadrant  Postoperative diagnosis: Same  Procedure: Left breast seed localized lumpectomy with left axillary sentinel lymph node mapping  Surgeon: Erroll Luna, MD  Anesthesia: LMA with local and pectoral block  EBL: Minimal  IV fluids: Per anesthesia record  Specimen: Left breast tissue with seed and clip verified by Faxitron. Grossly margins were negative. 3 left axillary sentinel nodes  Drains: None  Indications for procedure: Patient presents for left breast lumpectomy after being evaluated and desiring breast conserving surgery for stage I left breast cancer. Risk, benefits and other options and surgical treatments discussed.The procedure has been discussed with the patient. Alternatives to surgery have been discussed with the patient.  Risks of surgery include bleeding,  Infection,  Seroma formation, death,  and the need for further surgery.   The patient understands and wishes to proceed.Sentinel lymph node mapping and dissection has been discussed with the patient.  Risk of bleeding,  Infection,  Seroma formation,  Additional procedures,,  Shoulder weakness ,  Shoulder stiffness,  Nerve and blood vessel injury and reaction to the mapping dyes have been discussed.  Alternatives to surgery have been discussed with the patient.  The patient agrees to proceed.  Description of procedure: The patient was met in the holding area. Left breast was marked as correct side and neoprobe used to verify seed location. She under injection of technetium sulfur colloid and pectoral block per anesthesia. She was then taken back the operative room. She is placed supine upon the OR table. After induction of general esthesia left breast was prepped draped sterile fashion timeout was performed. Neoprobe was used to verify seed location with the assistance of her films. This was in the left lower outer quadrant at the border of the nipple areolar  complex. Curvilinear incision was made and dissection was carried down to excise the lesion which was quite superficial. This was abutting the undersurface of the nipple. Grossly the margins appeared clear and the area was removed. Faxitron revealed the seed and clip to be in specimen but as expected anterior margin was  likely close. The nipple was not involved therefore I will wait for final pathology before any determination of reexcision. Cavity was clipped. Is made hemostatic and irrigated. It was closed with 3-0 Vicryl and 4 Monocryl.  Neoprobe settings changed technetium. Hotspot identified in left axilla. 3 cm incision made dissection carried down to the level 1 contents. There were 3 hot nodes removed. Background counts approached 0. Irrigation was used and hemostasis achieved with cautery and Arista. Wound closed with 3-0 Vicryl and 4-0 Monocryl. Dermabond applied. All counts were found to be correct. Breast binder placed. Patient awoke expectant recovery in satisfactory condition.

## 2019-08-31 NOTE — Progress Notes (Signed)
Emotional support provided through nuclear medicine breast injections. Vital signs stable. Patient tolerated well.  

## 2019-08-31 NOTE — Transfer of Care (Signed)
Immediate Anesthesia Transfer of Care Note  Patient: Amy Fisher  Procedure(s) Performed: LEFT BREAST LUMPECTOMY WITH RADIOACTIVE SEED AND LEFT AXILLARY SENTINEL LYMPH NODE BIOPSY (Left Breast)  Patient Location: PACU  Anesthesia Type:GA combined with regional for post-op pain  Level of Consciousness: sedated  Airway & Oxygen Therapy: Patient Spontanous Breathing and Patient connected to face mask oxygen  Post-op Assessment: Report given to RN and Post -op Vital signs reviewed and stable  Post vital signs: Reviewed and stable  Last Vitals:  Vitals Value Taken Time  BP 109/64 08/31/19 0900  Temp    Pulse 65 08/31/19 0902  Resp 7 08/31/19 0902  SpO2 98 % 08/31/19 0902  Vitals shown include unvalidated device data.  Last Pain:  Vitals:   08/31/19 0648  TempSrc: Oral  PainSc: 0-No pain      Patients Stated Pain Goal: 3 ([id number])  Complications: No apparent anesthesia complications

## 2019-08-31 NOTE — Discharge Instructions (Signed)
[facility] °Office Phone Number [phone] ° °BREAST BIOPSY/ PARTIAL MASTECTOMY: POST OP INSTRUCTIONS ° °Always review your discharge instruction sheet given to you by the facility where your surgery was performed. ° °IF YOU HAVE DISABILITY OR FAMILY LEAVE FORMS, YOU MUST BRING THEM TO THE OFFICE FOR PROCESSING.  DO NOT GIVE THEM TO YOUR DOCTOR. ° °1. A prescription for pain medication may be given to you upon discharge.  Take your pain medication as prescribed, if needed.  If narcotic pain medicine is not needed, then you may take acetaminophen (Tylenol) or ibuprofen (Advil) as needed. °2. Take your usually prescribed medications unless otherwise directed °3. If you need a refill on your pain medication, please contact your pharmacy.  They will contact our office to request authorization.  Prescriptions will not be filled after 5pm or on week-ends. °4. You should eat very light the first 24 hours after surgery, such as soup, crackers, pudding, etc.  Resume your normal diet the day after surgery. °5. Most patients will experience some swelling and bruising in the breast.  Ice packs and a good support bra will help.  Swelling and bruising can take several days to resolve.  °6. It is common to experience some constipation if taking pain medication after surgery.  Increasing fluid intake and taking a stool softener will usually help or prevent this problem from occurring.  A mild laxative (Milk of Magnesia or Miralax) should be taken according to package directions if there are no bowel movements after 48 hours. °7. Unless discharge instructions indicate otherwise, you may remove your bandages 24-48 hours after surgery, and you may shower at that time.  You may have steri-strips (small skin tapes) in place directly over the incision.  These strips should be left on the skin for 7-10 days.  If your surgeon used skin glue on the incision, you may shower in 24 hours.  The glue will flake off over the  next 2-3 weeks.  Any sutures or staples will be removed at the office during your follow-up visit. °8. ACTIVITIES:  You may resume regular daily activities (gradually increasing) beginning the next day.  Wearing a good support bra or sports bra minimizes pain and swelling.  You may have sexual intercourse when it is comfortable. °a. You may drive when you no longer are taking prescription pain medication, you can comfortably wear a seatbelt, and you can safely maneuver your car and apply brakes. °b. RETURN TO WORK:  ______________________________________________________________________________________ °9. You should see your doctor in the office for a follow-up appointment approximately two weeks after your surgery.  Your doctor’s nurse will typically make your follow-up appointment when she calls you with your pathology report.  Expect your pathology report 2-3 business days after your surgery.  You may call to check if you do not hear from us after three days. °10. OTHER INSTRUCTIONS: _______________________________________________________________________________________________ _____________________________________________________________________________________________________________________________________ °_____________________________________________________________________________________________________________________________________ °_____________________________________________________________________________________________________________________________________ ° °WHEN TO CALL YOUR DOCTOR: °1. Fever over 101.0 °2. Nausea and/or vomiting. °3. Extreme swelling or bruising. °4. Continued bleeding from incision. °5. Increased pain, redness, or drainage from the incision. ° °The clinic staff is available to answer your questions during regular business hours.  Please don’t hesitate to call and ask to speak to one of the nurses for clinical concerns.  If you have a medical emergency, go to the nearest  emergency room or call 911.  A surgeon from [facility] is always on call at the hospital. ° °For further questions, please visit [email or web address]  ° ° ° ° ° ° °  Post Anesthesia Home Care Instructions ° °Activity: °Get plenty of rest for the remainder of the day. A responsible individual must stay with you for 24 hours following the procedure.  °For the next 24 hours, DO NOT: °-Drive a car °-Operate machinery °-Drink alcoholic beverages °-Take any medication unless instructed by your physician °-Make any legal decisions or sign important papers. ° °Meals: °Start with liquid foods such as gelatin or soup. Progress to regular foods as tolerated. Avoid greasy, spicy, heavy foods. If nausea and/or vomiting occur, drink only clear liquids until the nausea and/or vomiting subsides. Call your physician if vomiting continues. ° °Special Instructions/Symptoms: °Your throat may feel dry or sore from the anesthesia or the breathing tube placed in your throat during surgery. If this causes discomfort, gargle with warm salt water. The discomfort should disappear within 24 hours. ° °If you had a scopolamine patch placed behind your ear for the management of post- operative nausea and/or vomiting: ° °1. The medication in the patch is effective for 72 hours, after which it should be removed.  Wrap patch in a tissue and discard in the trash. Wash hands thoroughly with soap and water. °2. You may remove the patch earlier than 72 hours if you experience unpleasant side effects which may include dry mouth, dizziness or visual disturbances. °3. Avoid touching the patch. Wash your hands with soap and water after contact with the patch. °  ° ° ° ° ° ° ° °Regional Anesthesia Blocks ° °1. Numbness or the inability to move the "blocked" extremity may last from 3-48 hours after placement. The length of time depends on the medication injected and your individual response to the medication. If the numbness is not going  away after 48 hours, call your surgeon. ° °2. The extremity that is blocked will need to be protected until the numbness is gone and the  Strength has returned. Because you cannot feel it, you will need to take extra care to avoid injury. Because it may be weak, you may have difficulty moving it or using it. You may not know what position it is in without looking at it while the block is in effect. ° °3. For blocks in the legs and feet, returning to weight bearing and walking needs to be done carefully. You will need to wait until the numbness is entirely gone and the strength has returned. You should be able to move your leg and foot normally before you try and bear weight or walk. You will need someone to be with you when you first try to ensure you do not fall and possibly risk injury. ° °4. Bruising and tenderness at the needle site are common side effects and will resolve in a few days. ° °5. Persistent numbness or new problems with movement should be communicated to the surgeon or the [facility] ([phone])/ [facility] ([phone]). °

## 2019-08-31 NOTE — Progress Notes (Signed)
EKG reviewed by Dr. Lanetta Inch.  Will proceed with surgery as scheduled.

## 2019-08-31 NOTE — Anesthesia Procedure Notes (Signed)
Procedure Name: LMA Insertion Date/Time: 08/31/2019 7:42 AM Performed by: Maryella Shivers, CRNA Pre-anesthesia Checklist: Patient identified, Emergency Drugs available, Suction available and Patient being monitored Patient Re-evaluated:Patient Re-evaluated prior to induction Oxygen Delivery Method: Circle system utilized Preoxygenation: Pre-oxygenation with 100% oxygen Induction Type: IV induction Ventilation: Mask ventilation without difficulty LMA: LMA inserted LMA Size: 4.0 Number of attempts: 1 Airway Equipment and Method: Bite block Placement Confirmation: positive ETCO2 Tube secured with: Tape Dental Injury: Teeth and Oropharynx as per pre-operative assessment

## 2019-08-31 NOTE — Interval H&P Note (Signed)
History and Physical Interval Note:  08/31/2019 7:17 AM  Amy Fisher  has presented today for surgery, with the diagnosis of left breast cancer.  The various methods of treatment have been discussed with the patient and family. After consideration of risks, benefits and other options for treatment, the patient has consented to  Procedure(s): LEFT BREAST LUMPECTOMY WITH RADIOACTIVE SEED AND LEFT AXILLARY SENTINEL LYMPH NODE BIOPSY (Left) as a surgical intervention.  The patient's history has been reviewed, patient examined, no change in status, stable for surgery.  I have reviewed the patient's chart and labs.  Questions were answered to the patient's satisfaction.     [place]

## 2019-08-31 NOTE — Progress Notes (Signed)
   Assisted Dr. Woodrum with left, ultrasound guided, pectoralis block. Side rails up, monitors on throughout procedure. See vital signs in flow sheet. Tolerated Procedure well. 

## 2019-09-01 ENCOUNTER — Encounter: Payer: Self-pay | Admitting: *Deleted

## 2019-09-02 LAB — SURGICAL PATHOLOGY

## 2019-09-03 ENCOUNTER — Ambulatory Visit: Payer: Self-pay | Admitting: Surgery

## 2019-09-03 LAB — PAP IG AND HPV HIGH-RISK: HPV DNA High Risk: DETECTED — AB

## 2019-09-03 LAB — HPV TYPE 16 AND 18/45 RNA
HPV Type 16 RNA: NOT DETECTED
HPV Type 18/45 RNA: NOT DETECTED

## 2019-09-06 ENCOUNTER — Other Ambulatory Visit (HOSPITAL_COMMUNITY)
Admission: RE | Admit: 2019-09-06 | Discharge: 2019-09-06 | Disposition: A | Discharge status: Home / Self Care | Payer: 59 | Source: Ambulatory Visit | Attending: Surgery | Admitting: Surgery

## 2019-09-06 ENCOUNTER — Encounter (HOSPITAL_BASED_OUTPATIENT_CLINIC_OR_DEPARTMENT_OTHER): Payer: Self-pay | Admitting: Surgery

## 2019-09-06 ENCOUNTER — Other Ambulatory Visit: Payer: Self-pay

## 2019-09-06 ENCOUNTER — Telehealth: Payer: Self-pay | Admitting: *Deleted

## 2019-09-06 DIAGNOSIS — Z01812 Encounter for preprocedural laboratory examination: Secondary | ICD-10-CM | POA: Insufficient documentation

## 2019-09-06 DIAGNOSIS — Z20822 Contact with and (suspected) exposure to covid-19: Secondary | ICD-10-CM | POA: Diagnosis not present

## 2019-09-06 LAB — SARS CORONAVIRUS 2 (TAT 6-24 HRS): SARS Coronavirus 2: NEGATIVE

## 2019-09-06 NOTE — Progress Notes (Signed)
      Enhanced Recovery after Surgery for Orthopedics Enhanced Recovery after Surgery is a protocol used to improve the stress on your body and your recovery after surgery.  Patient Instructions  . The night before surgery:  o No food after midnight. ONLY clear liquids after midnight  . The day of surgery (if you do NOT have diabetes):  o Drink ONE (1) Pre-Surgery Clear Ensure as directed.   o This drink was given to you during your hospital  pre-op appointment visit. o The pre-op nurse will instruct you on the time to drink the  Pre-Surgery Ensure depending on your surgery time. o Finish the drink at the designated time by the pre-op nurse.  o Nothing else to drink after completing the  Pre-Surgery Clear Ensure.  . The day of surgery (if you have diabetes): o Drink ONE (1) Gatorade 2 (G2) as directed. o This drink was given to you during your hospital  pre-op appointment visit.  o The pre-op nurse will instruct you on the time to drink the   Gatorade 2 (G2) depending on your surgery time. o Color of the Gatorade may vary. Red is not allowed. o Nothing else to drink after completing the  Gatorade 2 (G2).         If you have questions, please contact your surgeon's office.  Surgical soap also given to patient with instructions for use.  Patient verbalized understanding of instructions.

## 2019-09-06 NOTE — Telephone Encounter (Signed)
Ordered oncotype per Dr. Magrinat.  Faxed requisition to pathology. 

## 2019-09-07 NOTE — Progress Notes (Signed)
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  Specimen still inadequate so she should come back in 3 months for a repeat test to allow time for vaginal/cervical tissues to replenish so we can get an adequate specimen at that time.

## 2019-09-08 ENCOUNTER — Encounter (HOSPITAL_BASED_OUTPATIENT_CLINIC_OR_DEPARTMENT_OTHER): Payer: Self-pay | Admitting: Surgery

## 2019-09-08 NOTE — Anesthesia Preprocedure Evaluation (Addendum)
Anesthesia Evaluation  Patient identified by MRN, date of birth, ID band Patient awake    Reviewed: Allergy & Precautions, NPO status , Patient's Chart, lab work & pertinent test results  Airway Mallampati: II   Neck ROM: Full    Dental no notable dental hx. (+) Teeth Intact, Caps   Pulmonary asthma ,    Pulmonary exam normal breath sounds clear to auscultation       Cardiovascular hypertension, Pt. on medications Normal cardiovascular exam Rhythm:Regular Rate:Normal     Neuro/Psych negative neurological ROS  negative psych ROS   GI/Hepatic negative GI ROS, Neg liver ROS,   Endo/Other  Left Breast Ca  Renal/GU negative Renal ROS  negative genitourinary   Musculoskeletal negative musculoskeletal ROS (+)   Abdominal   Peds  Hematology negative hematology ROS (+)   Anesthesia Other Findings   Reproductive/Obstetrics                            Anesthesia Physical Anesthesia Plan  ASA: II  Anesthesia Plan: General   Post-op Pain Management:    Induction: Intravenous  PONV Risk Score and Plan: 4 or greater and Midazolam, Scopolamine patch - Pre-op, Ondansetron, Treatment may vary due to age or medical condition and Dexamethasone  Airway Management Planned: LMA  Additional Equipment:   Intra-op Plan:   Post-operative Plan: Extubation in OR  Informed Consent: I have reviewed the patients History and Physical, chart, labs and discussed the procedure including the risks, benefits and alternatives for the proposed anesthesia with the patient or authorized representative who has indicated his/her understanding and acceptance.     Dental advisory given  Plan Discussed with: CRNA and Surgeon  Anesthesia Plan Comments:        Anesthesia Quick Evaluation

## 2019-09-08 NOTE — Progress Notes (Signed)
Called to update patient about new surgery time for 09/09/19 and to arrive at [facility]  at 0730

## 2019-09-09 ENCOUNTER — Other Ambulatory Visit: Payer: Self-pay

## 2019-09-09 ENCOUNTER — Ambulatory Visit (HOSPITAL_BASED_OUTPATIENT_CLINIC_OR_DEPARTMENT_OTHER)
Admission: RE | Admit: 2019-09-09 | Discharge: 2019-09-09 | Disposition: A | Discharge status: Home / Self Care | Payer: 59 | Attending: Surgery | Admitting: Surgery

## 2019-09-09 ENCOUNTER — Ambulatory Visit (HOSPITAL_BASED_OUTPATIENT_CLINIC_OR_DEPARTMENT_OTHER): Payer: 59 | Admitting: Anesthesiology

## 2019-09-09 ENCOUNTER — Encounter (HOSPITAL_BASED_OUTPATIENT_CLINIC_OR_DEPARTMENT_OTHER): Payer: Self-pay | Admitting: Surgery

## 2019-09-09 ENCOUNTER — Encounter (HOSPITAL_BASED_OUTPATIENT_CLINIC_OR_DEPARTMENT_OTHER)
Admission: RE | Disposition: A | Discharge status: Home / Self Care | Payer: Self-pay | Source: Home / Self Care | Attending: Surgery

## 2019-09-09 DIAGNOSIS — Z79899 Other long term (current) drug therapy: Secondary | ICD-10-CM | POA: Diagnosis not present

## 2019-09-09 DIAGNOSIS — Z170 Estrogen receptor positive status [ER+]: Secondary | ICD-10-CM | POA: Insufficient documentation

## 2019-09-09 DIAGNOSIS — J45909 Unspecified asthma, uncomplicated: Secondary | ICD-10-CM | POA: Insufficient documentation

## 2019-09-09 DIAGNOSIS — C50512 Malignant neoplasm of lower-outer quadrant of left female breast: Secondary | ICD-10-CM | POA: Diagnosis not present

## 2019-09-09 DIAGNOSIS — C50912 Malignant neoplasm of unspecified site of left female breast: Secondary | ICD-10-CM | POA: Diagnosis present

## 2019-09-09 DIAGNOSIS — I10 Essential (primary) hypertension: Secondary | ICD-10-CM | POA: Diagnosis not present

## 2019-09-09 HISTORY — PX: RE-EXCISION OF BREAST LUMPECTOMY: SHX6048

## 2019-09-09 SURGERY — EXCISION, LESION, BREAST
Anesthesia: General | Site: Breast | Laterality: Left

## 2019-09-09 MED ORDER — CHLORHEXIDINE GLUCONATE CLOTH 2 % EX PADS: 6.0000 | MEDICATED_PAD | Freq: Once | CUTANEOUS | Status: DC

## 2019-09-09 MED ORDER — BUPIVACAINE HCL (PF) 0.25 % IJ SOLN
INTRAMUSCULAR | Status: DC | PRN
  Administered 2019-09-09: 10 mL

## 2019-09-09 MED ORDER — FENTANYL CITRATE (PF) 100 MCG/2ML IJ SOLN
INTRAMUSCULAR | Status: AC
  Filled 2019-09-09: qty 2

## 2019-09-09 MED ORDER — SCOPOLAMINE 1 MG/3DAYS TD PT72: 1.0000 | MEDICATED_PATCH | TRANSDERMAL | Status: DC

## 2019-09-09 MED ORDER — OXYCODONE HCL 5 MG PO TABS: 5.0000 mg | ORAL_TABLET | Freq: Once | ORAL | Status: DC | PRN

## 2019-09-09 MED ORDER — PROPOFOL 500 MG/50ML IV EMUL
INTRAVENOUS | Status: AC
  Filled 2019-09-09: qty 50

## 2019-09-09 MED ORDER — FENTANYL CITRATE (PF) 100 MCG/2ML IJ SOLN: 25.0000 ug | INTRAMUSCULAR | Status: DC | PRN

## 2019-09-09 MED ORDER — DEXAMETHASONE SODIUM PHOSPHATE 4 MG/ML IJ SOLN
INTRAMUSCULAR | Status: DC | PRN
  Administered 2019-09-09: 10 mg via INTRAVENOUS

## 2019-09-09 MED ORDER — LACTATED RINGERS IV SOLN: INTRAVENOUS | Status: DC

## 2019-09-09 MED ORDER — FENTANYL CITRATE (PF) 100 MCG/2ML IJ SOLN
INTRAMUSCULAR | Status: DC | PRN
  Administered 2019-09-09 (×2): 50 ug via INTRAVENOUS

## 2019-09-09 MED ORDER — ONDANSETRON HCL 4 MG/2ML IJ SOLN
INTRAMUSCULAR | Status: DC | PRN
  Administered 2019-09-09: 4 mg via INTRAVENOUS

## 2019-09-09 MED ORDER — GABAPENTIN 300 MG PO CAPS
ORAL_CAPSULE | ORAL | Status: AC
  Filled 2019-09-09: qty 1

## 2019-09-09 MED ORDER — LIDOCAINE 2% (20 MG/ML) 5 ML SYRINGE
INTRAMUSCULAR | Status: AC
  Filled 2019-09-09: qty 5

## 2019-09-09 MED ORDER — GABAPENTIN 300 MG PO CAPS
300.0000 mg | ORAL_CAPSULE | ORAL | Status: AC
  Administered 2019-09-09: 08:00:00 300 mg via ORAL

## 2019-09-09 MED ORDER — METOCLOPRAMIDE HCL 5 MG/ML IJ SOLN: 10.0000 mg | Freq: Once | INTRAMUSCULAR | Status: DC | PRN

## 2019-09-09 MED ORDER — CEFAZOLIN SODIUM-DEXTROSE 2-4 GM/100ML-% IV SOLN
2.0000 g | INTRAVENOUS | Status: AC
  Administered 2019-09-09: 2 g via INTRAVENOUS

## 2019-09-09 MED ORDER — PROPOFOL 10 MG/ML IV BOLUS
INTRAVENOUS | Status: DC | PRN
  Administered 2019-09-09: 150 mg via INTRAVENOUS

## 2019-09-09 MED ORDER — BUPIVACAINE HCL (PF) 0.25 % IJ SOLN
INTRAMUSCULAR | Status: AC
  Filled 2019-09-09: qty 30

## 2019-09-09 MED ORDER — DEXAMETHASONE SODIUM PHOSPHATE 10 MG/ML IJ SOLN
INTRAMUSCULAR | Status: AC
  Filled 2019-09-09: qty 1

## 2019-09-09 MED ORDER — CEFAZOLIN SODIUM-DEXTROSE 2-4 GM/100ML-% IV SOLN
INTRAVENOUS | Status: AC
  Filled 2019-09-09: qty 100

## 2019-09-09 MED ORDER — LIDOCAINE 2% (20 MG/ML) 5 ML SYRINGE
INTRAMUSCULAR | Status: DC | PRN
  Administered 2019-09-09: 60 mg via INTRAVENOUS

## 2019-09-09 MED ORDER — ACETAMINOPHEN 500 MG PO TABS
ORAL_TABLET | ORAL | Status: AC
  Filled 2019-09-09: qty 2

## 2019-09-09 MED ORDER — ONDANSETRON HCL 4 MG/2ML IJ SOLN
INTRAMUSCULAR | Status: AC
  Filled 2019-09-09: qty 2

## 2019-09-09 MED ORDER — OXYCODONE HCL 5 MG/5ML PO SOLN: 5.0000 mg | Freq: Once | ORAL | Status: DC | PRN

## 2019-09-09 MED ORDER — MIDAZOLAM HCL 5 MG/5ML IJ SOLN
INTRAMUSCULAR | Status: DC | PRN
  Administered 2019-09-09: 2 mg via INTRAVENOUS

## 2019-09-09 MED ORDER — ACETAMINOPHEN 500 MG PO TABS
1000.0000 mg | ORAL_TABLET | ORAL | Status: AC
  Administered 2019-09-09: 1000 mg via ORAL

## 2019-09-09 MED ORDER — EPHEDRINE SULFATE-NACL 50-0.9 MG/10ML-% IV SOSY
PREFILLED_SYRINGE | INTRAVENOUS | Status: DC | PRN
  Administered 2019-09-09: 10 mg via INTRAVENOUS

## 2019-09-09 MED ORDER — MIDAZOLAM HCL 2 MG/2ML IJ SOLN
INTRAMUSCULAR | Status: AC
  Filled 2019-09-09: qty 2

## 2019-09-09 SURGICAL SUPPLY — 51 items
APPLIER CLIP 9.375 MED OPEN (MISCELLANEOUS) ×2
BINDER BREAST LRG (GAUZE/BANDAGES/DRESSINGS) IMPLANT
BINDER BREAST MEDIUM (GAUZE/BANDAGES/DRESSINGS) ×2 IMPLANT
BINDER BREAST XLRG (GAUZE/BANDAGES/DRESSINGS) IMPLANT
BINDER BREAST XXLRG (GAUZE/BANDAGES/DRESSINGS) IMPLANT
BLADE SURG 15 STRL LF DISP TIS (BLADE) ×1 IMPLANT
BLADE SURG 15 STRL SS (BLADE) ×1
CANISTER SUCT 1200ML W/VALVE (MISCELLANEOUS) ×2 IMPLANT
CHLORAPREP W/TINT 26 (MISCELLANEOUS) ×2 IMPLANT
CLIP APPLIE 9.375 MED OPEN (MISCELLANEOUS) ×1 IMPLANT
COVER BACK TABLE 60X90IN (DRAPES) ×2 IMPLANT
COVER MAYO STAND STRL (DRAPES) ×2 IMPLANT
COVER WAND RF STERILE (DRAPES) IMPLANT
DECANTER SPIKE VIAL GLASS SM (MISCELLANEOUS) ×2 IMPLANT
DERMABOND ADVANCED (GAUZE/BANDAGES/DRESSINGS) ×1
DERMABOND ADVANCED .7 DNX12 (GAUZE/BANDAGES/DRESSINGS) ×1 IMPLANT
DRAPE LAPAROSCOPIC ABDOMINAL (DRAPES) IMPLANT
DRAPE LAPAROTOMY 100X72 PEDS (DRAPES) ×2 IMPLANT
DRAPE UTILITY XL STRL (DRAPES) ×2 IMPLANT
ELECT COATED BLADE 2.86 ST (ELECTRODE) ×2 IMPLANT
ELECT REM PT RETURN 9FT ADLT (ELECTROSURGICAL) ×2
ELECTRODE REM PT RTRN 9FT ADLT (ELECTROSURGICAL) ×1 IMPLANT
GLOVE BIOGEL PI IND STRL 7.0 (GLOVE) ×2 IMPLANT
GLOVE BIOGEL PI IND STRL 8 (GLOVE) ×1 IMPLANT
GLOVE BIOGEL PI INDICATOR 7.0 (GLOVE) ×2
GLOVE BIOGEL PI INDICATOR 8 (GLOVE) ×1
GLOVE ECLIPSE 8.0 STRL XLNG CF (GLOVE) ×2 IMPLANT
GLOVE SURG SS PI 6.5 STRL IVOR (GLOVE) ×4 IMPLANT
GOWN STRL REUS W/ TWL LRG LVL3 (GOWN DISPOSABLE) ×2 IMPLANT
GOWN STRL REUS W/TWL LRG LVL3 (GOWN DISPOSABLE) ×2
HEMOSTAT ARISTA ABSORB 3G PWDR (HEMOSTASIS) IMPLANT
KIT MARKER MARGIN INK (KITS) ×2 IMPLANT
NDL SAFETY ECLIPSE 18X1.5 (NEEDLE) ×1 IMPLANT
NEEDLE HYPO 18GX1.5 SHARP (NEEDLE) ×1
NEEDLE HYPO 25X1 1.5 SAFETY (NEEDLE) ×2 IMPLANT
NS IRRIG 1000ML POUR BTL (IV SOLUTION) ×2 IMPLANT
PACK BASIN DAY SURGERY FS (CUSTOM PROCEDURE TRAY) ×2 IMPLANT
PENCIL BUTTON HOLSTER BLD 10FT (ELECTRODE) ×2 IMPLANT
PENCIL SMOKE EVACUATOR (MISCELLANEOUS) IMPLANT
SLEEVE SCD COMPRESS KNEE MED (MISCELLANEOUS) ×2 IMPLANT
SPONGE LAP 4X18 RFD (DISPOSABLE) ×2 IMPLANT
STAPLER VISISTAT 35W (STAPLE) IMPLANT
SUT MON AB 4-0 PC3 18 (SUTURE) ×2 IMPLANT
SUT SILK 2 0 SH (SUTURE) IMPLANT
SUT VICRYL 3-0 CR8 SH (SUTURE) ×2 IMPLANT
SYR 50ML LL SCALE MARK (SYRINGE) ×2 IMPLANT
SYR CONTROL 10ML LL (SYRINGE) ×2 IMPLANT
TOWEL GREEN STERILE FF (TOWEL DISPOSABLE) ×2 IMPLANT
TRAY FAXITRON CT DISP (TRAY / TRAY PROCEDURE) IMPLANT
TUBE CONNECTING 20X1/4 (TUBING) ×2 IMPLANT
YANKAUER SUCT BULB TIP NO VENT (SUCTIONS) ×2 IMPLANT

## 2019-09-09 NOTE — Anesthesia Postprocedure Evaluation (Signed)
Anesthesia Post Note  Patient: Amy Fisher  Procedure(s) Performed: LEFT BREAST RE-EXCISION OF LUMPECTOMY (Left Breast)     Patient location during evaluation: PACU Anesthesia Type: General Level of consciousness: awake and alert and oriented Pain management: pain level controlled Vital Signs Assessment: post-procedure vital signs reviewed and stable Respiratory status: spontaneous breathing, nonlabored ventilation and respiratory function stable Cardiovascular status: blood pressure returned to baseline and stable Postop Assessment: no apparent nausea or vomiting Anesthetic complications: no    Last Vitals:  Vitals:   09/09/19 1000 09/09/19 1018  BP: 123/81 (!) 145/81  Pulse: 77 75  Resp: 19 18  Temp:  36.6 C  SpO2: 98% 98%    Last Pain:  Vitals:   09/09/19 1018  TempSrc: Oral  PainSc: 0-No pain                 Nahlia Hellmann A.

## 2019-09-09 NOTE — Anesthesia Procedure Notes (Signed)
Procedure Name: LMA Insertion Date/Time: 09/09/2019 8:43 AM Performed by: Lieutenant Diego, CRNA Pre-anesthesia Checklist: Patient identified, Emergency Drugs available, Suction available and Patient being monitored Patient Re-evaluated:Patient Re-evaluated prior to induction Oxygen Delivery Method: Circle system utilized Preoxygenation: Pre-oxygenation with 100% oxygen Induction Type: IV induction Ventilation: Mask ventilation without difficulty LMA: LMA inserted LMA Size: 4.0 Number of attempts: 1 Placement Confirmation: positive ETCO2 and breath sounds checked- equal and bilateral Tube secured with: Tape Dental Injury: Teeth and Oropharynx as per pre-operative assessment

## 2019-09-09 NOTE — Discharge Instructions (Signed)
[place] Office Phone Number [phone]  BREAST BIOPSY/ PARTIAL MASTECTOMY: POST OP INSTRUCTIONS  Always review your discharge instruction sheet given to you by the facility where your surgery was performed.  IF YOU HAVE DISABILITY OR FAMILY LEAVE FORMS, YOU MUST BRING THEM TO THE OFFICE FOR PROCESSING.  DO NOT GIVE THEM TO YOUR DOCTOR.  1. A prescription for pain medication may be given to you upon discharge.  Take your pain medication as prescribed, if needed.  If narcotic pain medicine is not needed, then you may take acetaminophen (Tylenol) or ibuprofen (Advil) as needed. 2. Take your usually prescribed medications unless otherwise directed 3. If you need a refill on your pain medication, please contact your pharmacy.  They will contact our office to request authorization.  Prescriptions will not be filled after 5pm or on week-ends. 4. You should eat very light the first 24 hours after surgery, such as soup, crackers, pudding, etc.  Resume your normal diet the day after surgery. 5. Most patients will experience some swelling and bruising in the breast.  Ice packs and a good support bra will help.  Swelling and bruising can take several days to resolve.  6. It is common to experience some constipation if taking pain medication after surgery.  Increasing fluid intake and taking a stool softener will usually help or prevent this problem from occurring.  A mild laxative (Milk of Magnesia or Miralax) should be taken according to package directions if there are no bowel movements after 48 hours. 7. Unless discharge instructions indicate otherwise, you may remove your bandages 24-48 hours after surgery, and you may shower at that time.  You may have steri-strips (small skin tapes) in place directly over the incision.  These strips should be left on the skin for 7-10 days.  If your surgeon used skin glue on the incision, you may shower in 24 hours.  The glue will flake off over the  next 2-3 weeks.  Any sutures or staples will be removed at the office during your follow-up visit. 8. ACTIVITIES:  You may resume regular daily activities (gradually increasing) beginning the next day.  Wearing a good support bra or sports bra minimizes pain and swelling.  You may have sexual intercourse when it is comfortable. a. You may drive when you no longer are taking prescription pain medication, you can comfortably wear a seatbelt, and you can safely maneuver your car and apply brakes. b. RETURN TO WORK:  ______________________________________________________________________________________ 9. You should see your doctor in the office for a follow-up appointment approximately two weeks after your surgery.  Your doctor's nurse will typically make your follow-up appointment when she calls you with your pathology report.  Expect your pathology report 2-3 business days after your surgery.  You may call to check if you do not hear from [place] after three days. 10. OTHER INSTRUCTIONS: _______________________________________________________________________________________________ _____________________________________________________________________________________________________________________________________ _____________________________________________________________________________________________________________________________________ _____________________________________________________________________________________________________________________________________  WHEN TO CALL YOUR DOCTOR: 1. Fever over 101.0 2. Nausea and/or vomiting. 3. Extreme swelling or bruising. 4. Continued bleeding from incision. 5. Increased pain, redness, or drainage from the incision.  The clinic staff is available to answer your questions during regular business hours.  Please don't hesitate to call and ask to speak to one of the nurses for clinical concerns.  If you have a medical emergency, go to the nearest  emergency room or call 911.  A surgeon from [facility] is always on call at the hospital.  For further questions, please visit [email or web address]  Post Anesthesia Home Care Instructions  Activity: Get plenty of rest for the remainder of the day. A responsible individual must stay with you for 24 hours following the procedure.  For the next 24 hours, DO NOT: -Drive a car -Paediatric nurse -Drink alcoholic beverages -Take any medication unless instructed by your physician -Make any legal decisions or sign important papers.  Meals: Start with liquid foods such as gelatin or soup. Progress to regular foods as tolerated. Avoid greasy, spicy, heavy foods. If nausea and/or vomiting occur, drink only clear liquids until the nausea and/or vomiting subsides. Call your physician if vomiting continues.  Special Instructions/Symptoms: Your throat may feel dry or sore from the anesthesia or the breathing tube placed in your throat during surgery. If this causes discomfort, gargle with warm salt water. The discomfort should disappear within 24 hours.  If you had a scopolamine patch placed behind your ear for the management of post- operative nausea and/or vomiting:  1. The medication in the patch is effective for 72 hours, after which it should be removed.  Wrap patch in a tissue and discard in the trash. Wash hands thoroughly with soap and water. 2. You may remove the patch earlier than 72 hours if you experience unpleasant side effects which may include dry mouth, dizziness or visual disturbances. 3. Avoid touching the patch. Wash your hands with soap and water after contact with the patch.  May take Tylenol again at 2:15pm.

## 2019-09-09 NOTE — Interval H&P Note (Signed)
History and Physical Interval Note:  09/09/2019 8:22 AM  Amy Fisher  has presented today for surgery, with the diagnosis of LEFT BREAST CANCER.  The various methods of treatment have been discussed with the patient and family. After consideration of risks, benefits and other options for treatment, the patient has consented to  Procedure(s): LEFT BREAST RE-EXCISION OF LUMPECTOMY (Left) as a surgical intervention.  The patient's history has been reviewed, patient examined, no change in status, stable for surgery.  I have reviewed the patient's chart and labs.  Questions were answered to the patient's satisfaction.     [place]

## 2019-09-09 NOTE — Op Note (Signed)
Preoperative diagnosis: Left breast cancer with positive margin after lumpectomy  Postoperative diagnosis: Same  Procedure: Left breast reexcision lumpectomy  Surgeon: Thomas Cornett, MD  Anesthesia: General with local  EBL: Minimal  Specimen: All 6 margins of the left breast lumpectomy cavity sent to pathology  Drains: None  IV fluids: Per anesthesia record  Indications for procedure: The patient presents for reexcision left breast lumpectomy after lumpectomy revealed multiple close and positive margins.  Risk, benefits and rationale for reexcision discussed as well as better long-term outcome with clear margins.The procedure has been discussed with the patient. Alternatives to surgery have been discussed with the patient.  Risks of surgery include bleeding,  Infection,  Seroma formation, death,  and the need for further surgery.   The patient understands and wishes to proceed. The patient also had a seroma at her left axillary sentinel lymph node site.  I recommended aspiration at the same time since the incisions were relatively close together.  She agreed since she was having a lot of pressure in that area.  Description of procedure: The patient was met in the holding area.  Left breast was marked as correct side.  She did have a significant seroma at her left axillary site.  She was taken back to the operating.  She is placed supine upon the OR table.  After induction of general esthesia the left breast was prepped and draped in sterile fashion timeout was performed.  The lumpectomy incision was opened and the seroma was evacuated.  We also aspirated the left axillary seroma with a needle and removed 100 cc of serous noninfected fluid from that.  All 6 margins were excised from the lumpectomy cavity.  Local anesthetic was infiltrated as well in the lumpectomy cavity.  Hemostasis achieved the cavity was reclipped.  All specimens were inked and sent to pathology.  Wound closed with 3-0 Vicryl  and 4-0 Monocryl.  All final counts were found to be correct.  Breast binder placed.  The patient was awoke extubated taken to recovery in satisfactory condition. 

## 2019-09-09 NOTE — Transfer of Care (Signed)
Immediate Anesthesia Transfer of Care Note  Patient: Amy Fisher  Procedure(s) Performed: LEFT BREAST RE-EXCISION OF LUMPECTOMY (Left Breast)  Patient Location: PACU  Anesthesia Type:General  Level of Consciousness: drowsy  Airway & Oxygen Therapy: Patient Spontanous Breathing and Patient connected to face mask oxygen  Post-op Assessment: Report given to RN and Post -op Vital signs reviewed and stable  Post vital signs: Reviewed and stable  Last Vitals:  Vitals Value Taken Time  BP 113/69 09/09/19 0931  Temp    Pulse 67 09/09/19 0934  Resp 10 09/09/19 0934  SpO2 100 % 09/09/19 0934  Vitals shown include unvalidated device data.  Last Pain:  Vitals:   09/09/19 0801  TempSrc: Oral  PainSc: 5       Patients Stated Pain Goal: 2 ([id number])  Complications: No apparent anesthesia complications

## 2019-09-10 ENCOUNTER — Encounter: Payer: Self-pay | Admitting: *Deleted

## 2019-09-13 LAB — SURGICAL PATHOLOGY

## 2019-09-20 ENCOUNTER — Encounter: Payer: Self-pay | Admitting: *Deleted

## 2019-09-22 ENCOUNTER — Telehealth: Payer: Self-pay | Admitting: *Deleted

## 2019-09-22 NOTE — Telephone Encounter (Signed)
Received oncotype results of 13/4%. Left patient a voicemail to call back so I can give her results. Msg sent for an appointment with Dr. Sondra Come

## 2019-09-23 ENCOUNTER — Telehealth: Payer: Self-pay | Admitting: *Deleted

## 2019-09-23 NOTE — Telephone Encounter (Signed)
Received call back from patient and gave results of oncotype. Patient is ready to begin xrt. Patient will cancel appointments for 3/29 with Dr.Magrinat and will reschedule during last week of xrt.

## 2019-09-27 ENCOUNTER — Encounter: Payer: Self-pay | Admitting: Oncology

## 2019-09-27 ENCOUNTER — Encounter: Payer: Self-pay | Admitting: *Deleted

## 2019-09-28 ENCOUNTER — Encounter: Payer: Self-pay | Admitting: *Deleted

## 2019-10-07 ENCOUNTER — Other Ambulatory Visit: Payer: Self-pay

## 2019-10-07 ENCOUNTER — Ambulatory Visit
Admission: RE | Admit: 2019-10-07 | Discharge: 2019-10-07 | Disposition: A | Discharge status: Home / Self Care | Payer: 59 | Source: Ambulatory Visit | Attending: Radiation Oncology | Admitting: Radiation Oncology

## 2019-10-07 ENCOUNTER — Encounter: Payer: Self-pay | Admitting: Radiation Oncology

## 2019-10-07 VITALS — BP 119/68 | HR 61 | Temp 98.2°F | Resp 18 | Wt 129.0 lb

## 2019-10-07 DIAGNOSIS — Z510 Encounter for antineoplastic radiation therapy: Secondary | ICD-10-CM | POA: Diagnosis not present

## 2019-10-07 DIAGNOSIS — C50512 Malignant neoplasm of lower-outer quadrant of left female breast: Secondary | ICD-10-CM | POA: Insufficient documentation

## 2019-10-07 DIAGNOSIS — Z79899 Other long term (current) drug therapy: Secondary | ICD-10-CM | POA: Diagnosis not present

## 2019-10-07 DIAGNOSIS — Z170 Estrogen receptor positive status [ER+]: Secondary | ICD-10-CM | POA: Insufficient documentation

## 2019-10-07 NOTE — Progress Notes (Signed)
Radiation Oncology         [phone] ________________________________  Name: Amy Fisher MRN: [id number]  Date: 10/07/2019  DOB: [birth year]  Re-Evaluation Note  CC: Orpah Melter, MD  Magrinat, Virgie Dad, MD    ICD-10-CM   1. Malignant neoplasm of lower-outer quadrant of left breast of female, estrogen receptor positive ([place])  C50.512    Z17.0     Diagnosis:   Stage IA  (pT1c, pN0) Left Breast LOQ, Invasive Lobular Carcinoma, ER+ / PR+ / Her2-, Grade 2  Narrative:  The patient returns today to discuss radiation treatment options. She was seen in consultation on 08/11/2019.   Since consultation, she underwent genetic testing on 08/05/2019. Results returned on 08/19/2019 and were negative.  She opted to proceed with left lumpectomy on 08/31/2019. Pathology from the procedure revealed: invasive lobular carcinoma, grade 2, 1.8 cm; very focal involvement of junction of posterior and superior margins, less than 1 mm from lateral and inferior margins; negative lymph nodes (0/8).  Oncotype DX was obtained on the final surgical sample and the recurrence score of 13 predicts a risk of recurrence outside the breast over the next 9 years of 4%, if the patient's only systemic therapy is an antiestrogen for 5 years.  It also predicts no benefit from chemotherapy.  Re-excision of the positive margin was performed on 09/09/2019 and showed: 0.2 cm focus of invasive lobular carcinoma; 0.3 cm from new inferior margin. All other margins showed benign findings.  On review of systems, the patient reports she is doing well overall. She denies pain, lymphedema, and any other symptoms.    Allergies:  has No Known Allergies.  Meds: Current Outpatient Medications  Medication Sig Dispense Refill  . albuterol (VENTOLIN HFA) 108 (90 Base) MCG/ACT inhaler Inhale 2 puffs into the lungs every 6 (six) hours as needed for wheezing or shortness of breath.     Marland Kitchen ascorbic acid (VITAMIN C) 500 MG tablet Take 500 mg  by mouth daily.    . benazepril (LOTENSIN) 10 MG tablet Take 10 mg by mouth daily.    . cholecalciferol (VITAMIN D3) 25 MCG (1000 UNIT) tablet Take 1,000 Units by mouth daily.    Marland Kitchen HYDROcodone-acetaminophen (NORCO/VICODIN) 5-325 MG tablet Take 1 tablet by mouth every 6 (six) hours as needed for moderate pain. 12 tablet 0  . ibuprofen (ADVIL) 200 MG tablet Take by mouth.    . Melatonin 10 MG TABS Take 10-20 mg by mouth at bedtime as needed (sleep).    . Multiple Vitamin (MULTIVITAMIN) tablet Take 1 tablet by mouth daily.    . vitamin B-12 (CYANOCOBALAMIN) 1000 MCG tablet Take 1,000 mcg by mouth daily.     No current facility-administered medications for this encounter.    Physical Findings: The patient is in no acute distress. Patient is alert and oriented.  weight is 129 lb (58.5 kg). Her temperature is 98.2 F (36.8 C). Her blood pressure is 119/68 and her pulse is 61. Her respiration is 18 and oxygen saturation is 100%.  No significant changes. Lungs are clear to auscultation bilaterally. Heart has regular rate and rhythm. No palpable cervical, supraclavicular, or axillary adenopathy. Abdomen soft, non-tender, normal bowel sounds. Right Breast: no palpable mass, nipple discharge or bleeding. Left Breast: Well-healing scar in the periareolar region of the lower outer breast.  Some swelling noted in the breast.  No signs of infection.  Patient has a separate scar in the axillary region from her sentinel node procedure is also healing  well.  Lab Findings: Lab Results  Component Value Date   WBC 4.0 08/27/2019   HGB 14.7 08/27/2019   HCT 46.7 (H) 08/27/2019   MCV 95.3 08/27/2019   PLT 172 08/27/2019    Radiographic Findings: No results found.  Impression:  Stage IA (pT1c, pN0) Left Breast LOQ, Invasive Lobular Carcinoma, ER+ / PR+ / Her2-, Grade 2  Patient would be a good candidate for breast conservation with radiation therapy directed at the left breast.  She in addition would  appear to be a good candidate for hypofractionated accelerated radiation therapy.  Today I discussed the general course of treatment side effects and potential toxicities of radiation therapy in this situation with the patient.  She appears to understand and wishes to proceed with planned course of treatment.  Plan:  Patient is scheduled for CT simulation later today.  Treatments to start in approximately a week.  Anticipate 4 weeks of radiation therapy.  Will use cardiac sparing techniques if necessary.  -----------------------------------  Blair Promise, PhD, MD   This document serves as a record of services personally performed by Gery Pray, MD. It was created on his behalf by Wilburn Mylar, a trained medical scribe. The creation of this record is based on the scribe's personal observations and the provider's statements to them. This document has been checked and approved by the attending provider.

## 2019-10-07 NOTE — Patient Instructions (Signed)
Coronavirus (COVID-19) Are you at risk?  Are you at risk for the Coronavirus (COVID-19)?  To be considered HIGH RISK for Coronavirus (COVID-19), you have to meet the following criteria:  . Traveled to [place], [place], [place], [place] or [place]; or in the [place] to [place], [place], [place], or [place]; and have fever, cough, and shortness of breath within the last 2 weeks of travel OR . Been in close contact with a person diagnosed with COVID-19 within the last 2 weeks and have fever, cough, and shortness of breath . IF YOU DO NOT MEET THESE CRITERIA, YOU ARE CONSIDERED LOW RISK FOR COVID-19.  What to do if you are HIGH RISK for COVID-19?  . If you are having a medical emergency, call 911. . Seek medical care right away. Before you go to a doctor's office, urgent care or emergency department, call ahead and tell them about your recent travel, contact with someone diagnosed with COVID-19, and your symptoms. You should receive instructions from your physician's office regarding next steps of care.  . When you arrive at healthcare provider, tell the healthcare staff immediately you have returned from visiting [place], [place], [place], [place] or [place]; or traveled in the [place] to [place], [place], [place], or [place]; in the last two weeks or you have been in close contact with a person diagnosed with COVID-19 in the last 2 weeks.   . Tell the health care staff about your symptoms: fever, cough and shortness of breath. . After you have been seen by a medical provider, you will be either: o Tested for (COVID-19) and discharged home on quarantine except to seek medical care if symptoms worsen, and asked to  - Stay home and avoid contact with others until you get your results (4-5 days)  - Avoid travel on public transportation if possible (such as bus, train, or airplane) or o Sent to the Emergency Department by EMS for evaluation, COVID-19 testing, and possible  admission depending on your condition and test results.  What to do if you are LOW RISK for COVID-19?  Reduce your risk of any infection by using the same precautions used for avoiding the common cold or flu:  . Wash your hands often with soap and warm water for at least 20 seconds.  If soap and water are not readily available, use an alcohol-based hand sanitizer with at least 60% alcohol.  . If coughing or sneezing, cover your mouth and nose by coughing or sneezing into the elbow areas of your shirt or coat, into a tissue or into your sleeve (not your hands). . Avoid shaking hands with others and consider head nods or verbal greetings only. . Avoid touching your eyes, nose, or mouth with unwashed hands.  . Avoid close contact with people who are sick. . Avoid places or events with large numbers of people in one location, like concerts or sporting events. . Carefully consider travel plans you have or are making. . If you are planning any travel outside or inside the [place], visit the CDC's Travelers' Health webpage for the latest health notices. . If you have some symptoms but not all symptoms, continue to monitor at home and seek medical attention if your symptoms worsen. . If you are having a medical emergency, call 911.   ADDITIONAL HEALTHCARE OPTIONS FOR PATIENTS  [facility] / e-Visit: [email or web address]         [facility]: [phone]  [facility]: [phone]                   [facility]: [phone]   

## 2019-10-07 NOTE — Progress Notes (Signed)
Location of Breast Cancer: Malignant neoplasm of lower-outer quadrant of left breast of female, estrogen receptor positive ([place]).  Histology per Pathology Report: 08/31/19:  FINAL MICROSCOPIC DIAGNOSIS:   A. BREAST, LEFT, LUMPECTOMY:  - Invasive lobular carcinoma, grade 2, 1.8 cm  - Carcinoma very focally involves junction of posterior and superior  margins; is less than 1 mm from lateral and inferior margins; and is 1.5  mm from medial margin. See comment  - Negative for lymphovascular or perineural invasion  - Biopsy site changes  - See oncology table   B. LYMPH NODE, LEFT, SENTINEL, BIOPSY:  - Lymph node, negative for carcinoma (0/1), see comment   C. LYMPH NODE, LEFT, SENTINEL, BIOPSY:  - Lymph node, negative for carcinoma (0/1), see comment   D. LYMPH NODE, LEFT, SENTINEL, BIOPSY:  - Lymph node, negative for carcinoma (0/1), see comment   E. LYMPH NODE, LEFT, SENTINEL, BIOPSY:  - Lymph node, negative for carcinoma (0/1), see comment   F. LYMPH NODE, LEFT, SENTINEL, BIOPSY:  - Lymph node, negative for carcinoma (0/1), see comment   G. LYMPH NODE, LEFT, SENTINEL, BIOPSY:  - Lymph node, negative for carcinoma (0/1), see comment   H. LYMPH NODE, LEFT, SENTINEL, BIOPSY:  - Lymph node, negative for carcinoma (0/1), see comment   I. LYMPH NODE, LEFT, SENTINEL, BIOPSY:  - Lymph node, negative for carcinoma (0/1), see comment   09/09/19: FINAL MICROSCOPIC DIAGNOSIS:   A. BREAST, LEFT SUPERIOR MARGIN, EXCISION:  - Benign breast parenchyma with previous procedure-related changes  - Negative for carcinoma   B. BREAST, LEFT MEDIAL MARGIN, EXCISION:  - Benign breast parenchyma with previous procedure-related changes  - Negative for carcinoma   C. BREAST, LEFT LATERAL MARGIN, EXCISION:  - Benign breast parenchyma with previous procedure-related changes  - Negative for carcinoma   D. BREAST, LEFT POSTERIOR MARGIN, EXCISION:  - Benign breast parenchyma with previous  procedure-related changes  - Negative for carcinoma   E. BREAST, LEFT ANTERIOR MARGIN, EXCISION:  - Benign breast parenchyma with previous procedure-related changes  - Negative for carcinoma   F. BREAST, LEFT INFERIOR MARGIN, EXCISION:  - Focus of invasive lobular carcinoma, 0.2 cm  - Carcinoma is 0.3 cm from the inked new inferior margin  - Previous procedure-related changes   Receptor Status: ER(100%), PR (95%), Her2-neu (equivocalby immunohistochemistry (2+), but negative by fluorescent in situ hybridization with a signals ratio1.05and number per cell 2.00.), Ki-(2%)  Did patient present with symptoms (if so, please note symptoms) or was this found on screening mammography?: Rise Paganini B Royalshad routine screening mammography on 12/16/2020showing a possible abnormality in the leftbreast. She underwent leftdiagnostic mammography with tomography and leftbreast ultrasonography at [facility] on 12/23/2020showing: breast density category C;irregular 1.5 cm mass at 4 o'clock in the left breast with a 0.4 cm adjacent satellite nodule; no left axillary adenopathy.Physical exam showed a 5 mm blue domed cyst in the left breast a 4 o'clock, palpable as a BB size superficial mass and a discrete palpable 2 cm mass approximately 2 cm below the previous.  Accordingly on12/31/2020she proceeded to biopsy of the leftbreast area in question. The pathology from this procedure ([id number]) showed:invasive mammary carcinoma, grade 2, e-cadherin negative. Prognostic indicators significant for: estrogen receptor,100%positiveand progesterone receptor, 95%positive, both with strong staining intensity. Proliferation marker Ki67 at2%. HER2equivocalby immunohistochemistry (2+), but negative by fluorescent in situ hybridization with a signals ratio1.05and number per cell 2.00.  Past/Anticipated interventions by surgeon, if any:08/31/19: Procedure: Left breast seed localized lumpectomy with left  axillary sentinel lymph node mapping  Surgeon: Erroll Luna, MD  09/09/19: Procedure: Left breast reexcision lumpectomy  Surgeon: Erroll Luna, MD  Past/Anticipated interventions by medical oncology, if any: Chemotherapy Per Dr. Jana Hakim 08/11/19: She will return to see me in March. At that point she should be able to initiate antiestrogens  Lymphedema issues, if any: Pt denies s/s lymphedema.   Pain issues, if any: Pt denies c/o pain.  SAFETY ISSUES:  Prior radiation? no  Pacemaker/ICD? no  Possible current pregnancy?no  Is the patient on methotrexate? no  Current Complaints / other details:  Pt presents today for f/u new with Dr. Sondra Come for Radiation Oncology.  BP 119/68 (BP Location: Right Arm, Patient Position: Sitting, Cuff Size: Normal)   Pulse 61   Temp 98.2 F (36.8 C)   Resp 18   Wt 129 lb (58.5 kg)   LMP 03/31/2015   SpO2 100%   BMI 21.47 kg/m   Wt Readings from Last 3 Encounters:  10/07/19 129 lb (58.5 kg)  09/09/19 126 lb 15.8 oz (57.6 kg)  08/31/19 128 lb 4.9 oz (58.2 kg)       Loma Sousa, RN 10/07/2019,2:05 PM

## 2019-10-07 NOTE — Progress Notes (Signed)
Radiation Oncology         [phone] ________________________________  Name: Amy Fisher MRN: [id number]  Date: 10/07/2019  DOB: 02/06/1961  SIMULATION AND TREATMENT PLANNING NOTE    ICD-10-CM   1. Malignant neoplasm of lower-outer quadrant of left breast of female, estrogen receptor positive ([facility])  C50.512    Z17.0     DIAGNOSIS:  Stage IA  (pT1c, pN0) Left Breast LOQ, Invasive Lobular Carcinoma, ER+ / PR+ / Her2-, Grade 2  NARRATIVE:  The patient was brought to the [facility].  Identity was confirmed.  All relevant records and images related to the planned course of therapy were reviewed.  The patient freely provided informed written consent to proceed with treatment after reviewing the details related to the planned course of therapy. The consent form was witnessed and verified by the simulation staff.  Then, the patient was set-up in a stable reproducible  supine position for radiation therapy.  CT images were obtained.  Surface markings were placed.  The CT images were loaded into the planning software.  Then the target and avoidance structures were contoured.  Treatment planning then occurred.  The radiation prescription was entered and confirmed.  Then, I designed and supervised the construction of a total of 5 medically necessary complex treatment devices.  I have requested : 3D Simulation  I have requested a DVH of the following structures: lumpectomy cavity, heart, lungs.  I have ordered:CBC  PLAN:  The patient will receive 40.05 Gy in 15 fractions followed by a boost to the lumpectomy cavity of 10 Gy in 5 fractions.   Optical Surface Tracking Plan:  Since intensity modulated radiotherapy (IMRT) and 3D conformal radiation treatment methods are predicated on accurate and precise positioning for treatment, intrafraction motion monitoring is medically necessary to ensure accurate and safe treatment delivery.  The ability to quantify intrafraction motion without  excessive ionizing radiation dose can only be performed with optical surface tracking. Accordingly, surface imaging offers the opportunity to obtain 3D measurements of patient position throughout IMRT and 3D treatments without excessive radiation exposure.  I am ordering optical surface tracking for this patient's upcoming course of radiotherapy. ________________________________  Special treatment procedure   was performed today due to the extra time and effort required by myself to plan and prepare this patient for deep inspiration breath hold technique.  I have determined cardiac sparing to be of benefit to this patient to prevent long term cardiac damage due to radiation of the heart.  Bellows were placed on the patient's abdomen. To facilitate cardiac sparing, the patient was coached by the radiation therapists on breath hold techniques and breathing practice was performed. Practice waveforms were obtained. The patient was then scanned while maintaining breath hold in the treatment position.  This image was then transferred over to the imaging specialist. The imaging specialist then created a fusion of the free breathing and breath hold scans using the chest wall as the stable structure. I personally reviewed the fusion in axial, coronal and sagittal image planes.  Excellent cardiac sparing was obtained.  I felt the patient is an appropriate candidate for breath hold and the patient will be treated as such.  The image fusion was then reviewed with the patient to reinforce the necessity of reproducible breath hold.    -----------------------------------  Blair Promise, PhD, MD  This document serves as a record of services personally performed by Gery Pray, MD. It was created on his behalf by Wilburn Mylar,  a trained medical scribe. The creation of this record is based on the scribe's personal observations and the provider's statements to them. This document has been checked and approved by  the attending provider.

## 2019-10-11 ENCOUNTER — Encounter: Payer: Self-pay | Admitting: *Deleted

## 2019-10-11 ENCOUNTER — Telehealth: Payer: Self-pay | Admitting: Oncology

## 2019-10-11 NOTE — Telephone Encounter (Signed)
Scheduled appt per 3/22 sch message - mailed reminder letter with appt date and time

## 2019-10-13 DIAGNOSIS — Z510 Encounter for antineoplastic radiation therapy: Secondary | ICD-10-CM | POA: Diagnosis not present

## 2019-10-14 ENCOUNTER — Other Ambulatory Visit: Payer: Self-pay

## 2019-10-14 ENCOUNTER — Ambulatory Visit
Admission: RE | Admit: 2019-10-14 | Discharge: 2019-10-14 | Disposition: A | Discharge status: Home / Self Care | Payer: 59 | Source: Ambulatory Visit | Attending: Radiation Oncology | Admitting: Radiation Oncology

## 2019-10-14 DIAGNOSIS — C50512 Malignant neoplasm of lower-outer quadrant of left female breast: Secondary | ICD-10-CM

## 2019-10-14 DIAGNOSIS — Z510 Encounter for antineoplastic radiation therapy: Secondary | ICD-10-CM | POA: Diagnosis not present

## 2019-10-14 DIAGNOSIS — Z170 Estrogen receptor positive status [ER+]: Secondary | ICD-10-CM

## 2019-10-14 NOTE — Progress Notes (Signed)
  Radiation Oncology         [phone] ________________________________  Name: Amy Fisher MRN: [id number]  Date: 10/14/2019  DOB: 1960/09/25  Simulation Verification Note    ICD-10-CM   1. Malignant neoplasm of lower-outer quadrant of left breast of female, estrogen receptor positive ([place])  C50.512    Z17.0     Status: outpatient  NARRATIVE: The patient was brought to the treatment unit and placed in the planned treatment position. The clinical setup was verified. Then port films were obtained and uploaded to the radiation oncology medical record software.  The treatment beams were carefully compared against the planned radiation fields. The position location and shape of the radiation fields was reviewed. They targeted volume of tissue appears to be appropriately covered by the radiation beams. Organs at risk appear to be excluded as planned.  Based on my personal review, I approved the simulation verification. The patient's treatment will proceed as planned.  -----------------------------------  Blair Promise, PhD, MD

## 2019-10-15 ENCOUNTER — Other Ambulatory Visit: Payer: Self-pay

## 2019-10-15 ENCOUNTER — Ambulatory Visit
Admission: RE | Admit: 2019-10-15 | Discharge: 2019-10-15 | Disposition: A | Discharge status: Home / Self Care | Payer: 59 | Source: Ambulatory Visit | Attending: Radiation Oncology | Admitting: Radiation Oncology

## 2019-10-15 DIAGNOSIS — Z510 Encounter for antineoplastic radiation therapy: Secondary | ICD-10-CM | POA: Diagnosis not present

## 2019-10-18 ENCOUNTER — Other Ambulatory Visit: Payer: Self-pay

## 2019-10-18 ENCOUNTER — Other Ambulatory Visit: Payer: 59

## 2019-10-18 ENCOUNTER — Ambulatory Visit: Payer: 59 | Admitting: Oncology

## 2019-10-18 ENCOUNTER — Ambulatory Visit
Admission: RE | Admit: 2019-10-18 | Discharge: 2019-10-18 | Disposition: A | Discharge status: Home / Self Care | Payer: 59 | Source: Ambulatory Visit | Attending: Radiation Oncology | Admitting: Radiation Oncology

## 2019-10-18 DIAGNOSIS — Z510 Encounter for antineoplastic radiation therapy: Secondary | ICD-10-CM | POA: Diagnosis not present

## 2019-10-19 ENCOUNTER — Other Ambulatory Visit: Payer: Self-pay

## 2019-10-19 ENCOUNTER — Ambulatory Visit
Admission: RE | Admit: 2019-10-19 | Discharge: 2019-10-19 | Disposition: A | Discharge status: Home / Self Care | Payer: 59 | Source: Ambulatory Visit | Attending: Radiation Oncology | Admitting: Radiation Oncology

## 2019-10-19 DIAGNOSIS — Z510 Encounter for antineoplastic radiation therapy: Secondary | ICD-10-CM | POA: Diagnosis not present

## 2019-10-20 ENCOUNTER — Ambulatory Visit
Admission: RE | Admit: 2019-10-20 | Discharge: 2019-10-20 | Disposition: A | Discharge status: Home / Self Care | Payer: 59 | Source: Ambulatory Visit | Attending: Radiation Oncology | Admitting: Radiation Oncology

## 2019-10-20 ENCOUNTER — Other Ambulatory Visit: Payer: Self-pay

## 2019-10-20 DIAGNOSIS — Z510 Encounter for antineoplastic radiation therapy: Secondary | ICD-10-CM | POA: Diagnosis not present

## 2019-10-21 ENCOUNTER — Ambulatory Visit
Admission: RE | Admit: 2019-10-21 | Discharge: 2019-10-21 | Disposition: A | Discharge status: Home / Self Care | Payer: 59 | Source: Ambulatory Visit | Attending: Radiation Oncology | Admitting: Radiation Oncology

## 2019-10-21 ENCOUNTER — Other Ambulatory Visit: Payer: Self-pay

## 2019-10-21 DIAGNOSIS — Z510 Encounter for antineoplastic radiation therapy: Secondary | ICD-10-CM | POA: Diagnosis not present

## 2019-10-21 DIAGNOSIS — C50512 Malignant neoplasm of lower-outer quadrant of left female breast: Secondary | ICD-10-CM | POA: Diagnosis not present

## 2019-10-21 DIAGNOSIS — Z170 Estrogen receptor positive status [ER+]: Secondary | ICD-10-CM | POA: Diagnosis not present

## 2019-10-22 ENCOUNTER — Other Ambulatory Visit: Payer: Self-pay

## 2019-10-22 ENCOUNTER — Ambulatory Visit
Admission: RE | Admit: 2019-10-22 | Discharge: 2019-10-22 | Disposition: A | Discharge status: Home / Self Care | Payer: 59 | Source: Ambulatory Visit | Attending: Radiation Oncology | Admitting: Radiation Oncology

## 2019-10-22 DIAGNOSIS — Z510 Encounter for antineoplastic radiation therapy: Secondary | ICD-10-CM | POA: Diagnosis not present

## 2019-10-25 ENCOUNTER — Ambulatory Visit
Admission: RE | Admit: 2019-10-25 | Discharge: 2019-10-25 | Disposition: A | Discharge status: Home / Self Care | Payer: 59 | Source: Ambulatory Visit | Attending: Radiation Oncology | Admitting: Radiation Oncology

## 2019-10-25 ENCOUNTER — Other Ambulatory Visit: Payer: Self-pay

## 2019-10-25 DIAGNOSIS — Z510 Encounter for antineoplastic radiation therapy: Secondary | ICD-10-CM | POA: Diagnosis not present

## 2019-10-26 ENCOUNTER — Ambulatory Visit: Payer: 59 | Admitting: Radiation Oncology

## 2019-10-26 ENCOUNTER — Other Ambulatory Visit: Payer: Self-pay

## 2019-10-26 ENCOUNTER — Ambulatory Visit
Admission: RE | Admit: 2019-10-26 | Discharge: 2019-10-26 | Disposition: A | Discharge status: Home / Self Care | Payer: 59 | Source: Ambulatory Visit | Attending: Radiation Oncology | Admitting: Radiation Oncology

## 2019-10-26 DIAGNOSIS — Z510 Encounter for antineoplastic radiation therapy: Secondary | ICD-10-CM | POA: Diagnosis not present

## 2019-10-27 ENCOUNTER — Ambulatory Visit
Admission: RE | Admit: 2019-10-27 | Discharge: 2019-10-27 | Disposition: A | Discharge status: Home / Self Care | Payer: 59 | Source: Ambulatory Visit | Attending: Radiation Oncology | Admitting: Radiation Oncology

## 2019-10-27 DIAGNOSIS — Z510 Encounter for antineoplastic radiation therapy: Secondary | ICD-10-CM | POA: Diagnosis not present

## 2019-10-28 ENCOUNTER — Ambulatory Visit
Admission: RE | Admit: 2019-10-28 | Discharge: 2019-10-28 | Disposition: A | Discharge status: Home / Self Care | Payer: 59 | Source: Ambulatory Visit | Attending: Radiation Oncology | Admitting: Radiation Oncology

## 2019-10-28 ENCOUNTER — Other Ambulatory Visit: Payer: Self-pay

## 2019-10-28 DIAGNOSIS — Z510 Encounter for antineoplastic radiation therapy: Secondary | ICD-10-CM | POA: Diagnosis not present

## 2019-10-29 ENCOUNTER — Ambulatory Visit
Admission: RE | Admit: 2019-10-29 | Discharge: 2019-10-29 | Disposition: A | Discharge status: Home / Self Care | Payer: 59 | Source: Ambulatory Visit | Attending: Radiation Oncology | Admitting: Radiation Oncology

## 2019-10-29 ENCOUNTER — Other Ambulatory Visit: Payer: Self-pay

## 2019-10-29 DIAGNOSIS — Z510 Encounter for antineoplastic radiation therapy: Secondary | ICD-10-CM | POA: Diagnosis not present

## 2019-11-01 ENCOUNTER — Ambulatory Visit
Admission: RE | Admit: 2019-11-01 | Discharge: 2019-11-01 | Disposition: A | Discharge status: Home / Self Care | Payer: 59 | Source: Ambulatory Visit | Attending: Radiation Oncology | Admitting: Radiation Oncology

## 2019-11-01 ENCOUNTER — Other Ambulatory Visit: Payer: Self-pay

## 2019-11-01 DIAGNOSIS — Z510 Encounter for antineoplastic radiation therapy: Secondary | ICD-10-CM | POA: Diagnosis not present

## 2019-11-02 ENCOUNTER — Ambulatory Visit: Payer: 59 | Admitting: Radiation Oncology

## 2019-11-02 ENCOUNTER — Other Ambulatory Visit: Payer: Self-pay

## 2019-11-02 ENCOUNTER — Ambulatory Visit
Admission: RE | Admit: 2019-11-02 | Discharge: 2019-11-02 | Disposition: A | Discharge status: Home / Self Care | Payer: 59 | Source: Ambulatory Visit | Attending: Radiation Oncology | Admitting: Radiation Oncology

## 2019-11-02 DIAGNOSIS — Z510 Encounter for antineoplastic radiation therapy: Secondary | ICD-10-CM | POA: Diagnosis not present

## 2019-11-03 ENCOUNTER — Ambulatory Visit
Admission: RE | Admit: 2019-11-03 | Discharge: 2019-11-03 | Disposition: A | Discharge status: Home / Self Care | Payer: 59 | Source: Ambulatory Visit | Attending: Radiation Oncology | Admitting: Radiation Oncology

## 2019-11-03 DIAGNOSIS — Z510 Encounter for antineoplastic radiation therapy: Secondary | ICD-10-CM | POA: Diagnosis not present

## 2019-11-04 ENCOUNTER — Other Ambulatory Visit: Payer: Self-pay

## 2019-11-04 ENCOUNTER — Ambulatory Visit
Admission: RE | Admit: 2019-11-04 | Discharge: 2019-11-04 | Disposition: A | Discharge status: Home / Self Care | Payer: 59 | Source: Ambulatory Visit | Attending: Radiation Oncology | Admitting: Radiation Oncology

## 2019-11-04 DIAGNOSIS — Z510 Encounter for antineoplastic radiation therapy: Secondary | ICD-10-CM | POA: Diagnosis not present

## 2019-11-05 ENCOUNTER — Ambulatory Visit
Admission: RE | Admit: 2019-11-05 | Discharge: 2019-11-05 | Disposition: A | Discharge status: Home / Self Care | Payer: 59 | Source: Ambulatory Visit | Attending: Radiation Oncology | Admitting: Radiation Oncology

## 2019-11-05 ENCOUNTER — Other Ambulatory Visit: Payer: Self-pay

## 2019-11-05 DIAGNOSIS — Z510 Encounter for antineoplastic radiation therapy: Secondary | ICD-10-CM | POA: Diagnosis not present

## 2019-11-08 ENCOUNTER — Ambulatory Visit
Admission: RE | Admit: 2019-11-08 | Discharge: 2019-11-08 | Disposition: A | Discharge status: Home / Self Care | Payer: 59 | Source: Ambulatory Visit | Attending: Radiation Oncology | Admitting: Radiation Oncology

## 2019-11-08 ENCOUNTER — Other Ambulatory Visit: Payer: Self-pay

## 2019-11-08 DIAGNOSIS — Z510 Encounter for antineoplastic radiation therapy: Secondary | ICD-10-CM | POA: Diagnosis not present

## 2019-11-09 ENCOUNTER — Ambulatory Visit
Admission: RE | Admit: 2019-11-09 | Discharge: 2019-11-09 | Disposition: A | Discharge status: Home / Self Care | Payer: 59 | Source: Ambulatory Visit | Attending: Radiation Oncology | Admitting: Radiation Oncology

## 2019-11-09 ENCOUNTER — Other Ambulatory Visit: Payer: Self-pay

## 2019-11-09 DIAGNOSIS — Z510 Encounter for antineoplastic radiation therapy: Secondary | ICD-10-CM | POA: Diagnosis not present

## 2019-11-10 ENCOUNTER — Encounter: Payer: Self-pay | Admitting: Radiation Oncology

## 2019-11-10 ENCOUNTER — Ambulatory Visit
Admission: RE | Admit: 2019-11-10 | Discharge: 2019-11-10 | Disposition: A | Discharge status: Home / Self Care | Payer: 59 | Source: Ambulatory Visit | Attending: Radiation Oncology | Admitting: Radiation Oncology

## 2019-11-10 ENCOUNTER — Encounter: Payer: Self-pay | Admitting: *Deleted

## 2019-11-10 DIAGNOSIS — Z510 Encounter for antineoplastic radiation therapy: Secondary | ICD-10-CM | POA: Diagnosis not present

## 2019-11-21 NOTE — Progress Notes (Signed)
[place]  [phone] [phone]     ID: Amy Fisher DOB: 1960-12-12  MR#: [id number]  GYK#:[id number]  Patient Care Team: Orpah Melter, MD as PCP - General (Family Medicine) Mauro Kaufmann, RN as Oncology Nurse Navigator [name], RN as Oncology Nurse Navigator Alphonsa Overall, MD as Consulting Physician (General Surgery) Emilio Baylock, Virgie Dad, MD as Consulting Physician (Oncology) Gery Pray, MD as Consulting Physician (Radiation Oncology) Chauncey Cruel, MD OTHER MD:   CHIEF COMPLAINT: Estrogen receptor positive lobular breast cancer  CURRENT TREATMENT:    INTERVAL HISTORY: Amy Fisher returns today for follow up of her estrogen receptor positive lobular breast cancer.  Her genetic testing results were negative.  She opted to proceed with left lumpectomy and sentinel lymph node biopsy on 08/31/2019 under Dr. [name]. Pathology from the procedure ([id number]) revealed: invasive lobular carcinoma, grade 2, 1.8 cm; focal involvement in junction of posterior and superior margins.  All 8 biopsied lymph nodes were negative for carcinoma.  Oncotype DX was obtained on the final surgical sample and the recurrence score of 13 predicts a risk of recurrence outside the breast over the next 9 years of 4%, if the patient's only systemic therapy is an antiestrogen for 5 years.  It also predicts no benefit from chemotherapy.  She underwent re-excision of the positive margin on 09/09/2019. Pathology [id number]) showed a 0.2 cm focus of invasive lobular carcinoma to the inferior margin.  She was referred back to Dr. Sondra Come on 10/07/2019 and received treatment from 10/14/2019 through 11/10/2019.   REVIEW OF SYSTEMS: Amy Fisher did remarkably well with her radiation treatments.  She had some erythema and minimal peeling, but no decrease in her energy level.  In fact she continued to walk jog run as before.  She also does indoor cycling and is  doing some weights.  Sometimes she feels like she has to gasp for air when she exercises vigorously.  This may be exercise-induced asthma.  She has been prescribed an inhaler which she does not find useful but which on demonstration today she is not using appropriately.  She is very concerned regarding the possibility of weight gain with antiestrogens.  She has some sensitivity and numbness in the surgical axilla.  There has been no evidence of upper extremity lymphedema.  A detailed review of systems today was otherwise stable.   HISTORY OF CURRENT ILLNESS: From the original intake note:  Amy Fisher had routine screening mammography on 07/07/2019 showing a possible abnormality in the left breast. She underwent left diagnostic mammography with tomography and left breast ultrasonography at [facility] on 07/14/2019 showing: breast density category C; irregular 1.5 cm mass at 4 o'clock in the left breast with a 0.4 cm adjacent satellite nodule; no left axillary adenopathy. Physical exam showed a 5 mm blue domed cyst in the left breast a 4 o'clock, palpable as a BB size superficial mass and a discrete palpable 2 cm mass approximately 2 cm below the previous.  Accordingly on 07/22/2019 she proceeded to biopsy of the left breast area in question. The pathology from this procedure ([id number]) showed: invasive mammary carcinoma, grade 2, e-cadherin negative. Prognostic indicators significant for: estrogen receptor, 100% positive and progesterone receptor, 95% positive, both with strong staining intensity. Proliferation marker Ki67 at 2%. HER2 equivocal by immunohistochemistry (2+), but negative by fluorescent in situ hybridization with a signals ratio 1.05 and number per cell 2.00.  The patient's subsequent history is as detailed below.   PAST MEDICAL  HISTORY: Past Medical History:  Diagnosis Date  . Asthma   . Cancer ([place])    Breast  . Cervical dysplasia    lgsil 2008, ascus 2009, normal  Pap smears afterwards  . Fibroid   . Hypertension   Exercise-induced asthma, likely bilateral early carpal tunnel   PAST SURGICAL HISTORY: Past Surgical History:  Procedure Laterality Date  . BREAST LUMPECTOMY WITH RADIOACTIVE SEED AND SENTINEL LYMPH NODE BIOPSY Left 08/31/2019   Procedure: LEFT BREAST LUMPECTOMY WITH RADIOACTIVE SEED AND LEFT AXILLARY SENTINEL LYMPH NODE BIOPSY;  Surgeon: Erroll Luna, MD;  Location: [facility];  Service: General;  Laterality: Left;  . BREAST SURGERY     Breast Biopsy  . IUD REMOVAL  07/2017   Mirena  . PILONIDAL CYST EXCISION    . RE-EXCISION OF BREAST LUMPECTOMY Left 09/09/2019   Procedure: LEFT BREAST RE-EXCISION OF LUMPECTOMY AND EVACUATION OF LEFT AXILLARY SEROMA;  Surgeon: Erroll Luna, MD;  Location: [facility];  Service: General;  Laterality: Left;    FAMILY HISTORY: Family History  Problem Relation Age of Onset  . Cancer Maternal Grandmother        ? type  . Breast cancer Mother 27  . Celiac disease Sister   . Alzheimer's disease Father   . Cancer Paternal Aunt        ? type  . Alzheimer's disease Paternal Grandfather   . Breast cancer Other        dx70s   Patient has 1 sister, 39, and 1 brother, 44, with no history of cancer. No cancers in her 3 nephews.  Amy Fisher mother was diagnosed with breast cancer at 58 and is living at 15. Patient has 1 maternal uncle, no cancers. No known cancers for her 2 female cousins. Patient's maternal grandmother did have cancer but is unsure the type. Maternal grandmother's sister did have breast cancer in her 46s. Maternal grandfather died in his 74s due to heart issues.  Amy Fisher father died at 55 due to Alzheimer's. Patient had 1 paternal aunt, she had cancer but unsure type. No cancers for her 3 paternal female cousins. Paternal grandparents both died in their 51s.  Amy Fisher is unaware of previous family history of genetic testing for hereditary cancer  risks. Patient's maternal ancestors are of Caucasian descent, and paternal ancestors are of Caucasian descent. There is no reported Ashkenazi Jewish ancestry. There is no known consanguinity.   GYNECOLOGIC HISTORY:  Patient's last menstrual period was 03/31/2015. Menarche: 59 years old [place] P 1 LMP 2019 Contraceptive: history of Mirena IUD (removed 07/2017) HRT no Hysterectomy? no BSO? 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 She is divorced and lives by herself.  She had a son at age 22 whom she gave up for adoption but she has since reconstructed.  [name] is currently 65 and is a Geneticist, molecular.  The patient has 1 grandchild.  She attends a local [employer] DIRECTIVES: To be discussed   HEALTH MAINTENANCE: Social History   Tobacco Use  . Smoking status: Never Smoker  . Smokeless tobacco: Never Used  Substance Use Topics  . Alcohol use: Yes    Alcohol/week: 0.0 standard drinks    Comment: OCC, WINE  . Drug use: No     Colonoscopy: 2016  PAP: 06/2013, negative (repeat due 06/2018)  Bone density:    No Known Allergies  Current Outpatient Medications  Medication Sig Dispense Refill  . albuterol (VENTOLIN HFA) 108 (90 Base) MCG/ACT inhaler Inhale 2 puffs into the lungs every 6 (six) hours as needed for wheezing or shortness of breath.     Marland Kitchen ascorbic acid (VITAMIN C) 500 MG tablet Take 500 mg by mouth daily.    . benazepril (LOTENSIN) 10 MG tablet Take 10 mg by mouth daily.    . cholecalciferol (VITAMIN D3) 25 MCG (1000 UNIT) tablet Take 1,000 Units by mouth daily.    Marland Kitchen HYDROcodone-acetaminophen (NORCO/VICODIN) 5-325 MG tablet Take 1 tablet by mouth every 6 (six) hours as needed for moderate pain. 12 tablet 0  . ibuprofen (ADVIL) 200 MG tablet Take by mouth.    . Melatonin 10 MG TABS Take 10-20 mg by  mouth at bedtime as needed (sleep).    . Multiple Vitamin (MULTIVITAMIN) tablet Take 1 tablet by mouth daily.    . tamoxifen (NOLVADEX) 20 MG tablet Take 1 tablet (20 mg total) by mouth daily. 90 tablet 12  . vitamin B-12 (CYANOCOBALAMIN) 1000 MCG tablet Take 1,000 mcg by mouth daily.     No current facility-administered medications for this visit.    OBJECTIVE: Middle-aged white woman who appears well  Vitals:   11/22/19 1350  BP: (!) 120/56  Pulse: 61  Resp: 18  Temp: 98.2 F (36.8 C)  SpO2: 100%     Body mass index is 21.32 kg/m.   Wt Readings from Last 3 Encounters:  11/22/19 128 lb 1.6 oz (58.1 kg)  10/07/19 129 lb (58.5 kg)  09/09/19 126 lb 15.8 oz (57.6 kg)      ECOG FS:0 - Asymptomatic  Sclerae unicteric, EOMs intact Wearing a mask No cervical or supraclavicular adenopathy Lungs no rales or rhonchi Heart regular rate and rhythm Abd soft, nontender, positive bowel sounds MSK no focal spinal tenderness, no upper extremity lymphedema Neuro: nonfocal, well oriented, appropriate affect Breasts: The right breast is unremarkable.  The left breast is status post lumpectomy and radiation.  The cosmetic result is excellent.  There is minimal hyperpigmentation.  There is no evidence of local recurrence.  Both axillae are benign.   LAB RESULTS:  CMP     Component Value Date/Time   NA 143 08/27/2019 0829   K 3.8 08/27/2019 0829   CL 110 08/27/2019 0829   CO2 24 08/27/2019 0829   GLUCOSE 73 08/27/2019 0829   BUN 16 08/27/2019 0829   CREATININE 0.70 08/27/2019 0829   CREATININE 0.69 06/03/2012 0940   CALCIUM 10.2 08/27/2019 0829   PROT 6.8 06/03/2012 0940   ALBUMIN 4.6 06/03/2012 0940   AST 15 06/03/2012 0940   ALT 13 06/03/2012 0940   ALKPHOS 38 (L) 06/03/2012 0940   BILITOT 0.5 06/03/2012 0940   GFRNONAA >60 08/27/2019 0829   GFRAA >60 08/27/2019 0829    No results found for: TOTALPROTELP, ALBUMINELP, A1GS, A2GS, BETS, BETA2SER, GAMS, MSPIKE, SPEI  Lab  Results  Component Value Date   WBC 4.0 08/27/2019   NEUTROABS 3.2 06/03/2012   HGB 14.7 08/27/2019   HCT 46.7 (H) 08/27/2019   MCV 95.3 08/27/2019   PLT 172 08/27/2019    No results found for: LABCA2  No components found for: UMPNTI144  No results for input(s): INR in the last 168 hours.  No results found for: LABCA2  No results found for: RXV400  No results found for: QQP619  No results found for: JKD326  No results found for: CA2729  No components found for: HGQUANT  No results found for: CEA1 / No results found for: CEA1   No results found for: AFPTUMOR  No results found for: CHROMOGRNA  No results found for: KPAFRELGTCHN, LAMBDASER, KAPLAMBRATIO (kappa/lambda light chains)  No results found for: HGBA, HGBA2QUANT, HGBFQUANT, HGBSQUAN (Hemoglobinopathy evaluation)   No results found for: LDH  No results found for: IRON, TIBC, IRONPCTSAT (Iron and TIBC)  No results found for: FERRITIN  Urinalysis    Component Value Date/Time   COLORURINE DARK YELLOW 04/07/2015 0850   APPEARANCEUR TURBID (A) 04/07/2015 0850   LABSPEC 1.031 04/07/2015 0850   PHURINE 5.5 04/07/2015 0850   GLUCOSEU NEGATIVE 04/07/2015 [place] 04/07/2015 0850   BILIRUBINUR NEGATIVE 04/07/2015 0850   Many 04/07/2015 0850   PROTEINUR NEGATIVE 04/07/2015 0850   UROBILINOGEN 0.2 06/03/2012 0940   NITRITE NEGATIVE 04/07/2015 0850   LEUKOCYTESUR TRACE (A) 04/07/2015 0850    STUDIES: No results found.   ELIGIBLE FOR AVAILABLE RESEARCH PROTOCOL: No  ASSESSMENT: 59 y.o. [place], [place] woman status post left breast lower outer quadrant biopsy 07/22/2019 for a clinical T1c N0, stage IA invasive lobular carcinoma, E-cadherin negative, estrogen and progesterone receptor positive, HER-2 not amplified, with an MIB-1 of 2%  (1) genetics testing 08/18/2019 through the [facility] Breast Cancer STAT Panel + Common Hereditary Cancers Panels found no deleterious mutations in  ATM, BRCA1, BRCA2, CDH1, CHEK2, PALB2, PTEN, STK11 and TP53, APC, ATM, AXIN2, BARD1, BMPR1A, BRCA1, BRCA2, BRIP1, CDH1, CDKN2A (p14ARF), CDKN2A (p16INK4a), CKD4, CHEK2, CTNNA1, DICER1, EPCAM (Deletion/duplication testing only), GREM1 (promoter region deletion/duplication testing only), KIT, MEN1, MLH1, MSH2, MSH3, MSH6, MUTYH, NBN, NF1, NHTL1, PALB2, PDGFRA, PMS2, POLD1, POLE, PTEN, RAD50, RAD51C, RAD51D, RNF43, SDHB, SDHC, SDHD, SMAD4, SMARCA4. STK11, TP53, TSC1, TSC2, and VHL.  The following genes were evaluated for sequence changes only: SDHA and HOXB13 c.251G>A variant only.  (2) status post left lumpectomy and sentinel lymph node sampling 08/31/2019 for a pT1c pN0, stage IA invasive ductal carcinoma, grade 2, with focally positive posterior and superior margins.  (a) a total of 8 left axillary lymph nodes were removed  (b) additional surgery 09/09/2019 showed a 0.2 cm focus of invasive lobular carcinoma 0.3 cm from the new left inferior margin  (3) Oncotype score of 13 predicts a risk of recurrence outside the breast in the next 9 years of 4% if the patient's only systemic therapy is antiestrogens for 5 years.  It also predicts no benefit from chemotherapy.  (4) adjuvant radiation 10/14/2019 through 11/10/2019  (5) tamoxifen started 11/22/2019   PLAN: Rise Paganini did remarkably well with her surgeries and radiation and is now ready to discuss systemic therapy.  Today we reviewed the Oncotype results which are very favorable and she understands that chemotherapy would not improve her already good prognosis but that to obtain that prognosis she does need to take an antiestrogen for 5 years.  We discussed the difference between tamoxifen and anastrozole in detail. She understands that anastrozole and the aromatase inhibitors in general work by blocking estrogen production. Accordingly vaginal dryness, decrease in bone density, and of course hot flashes can result. The aromatase inhibitors can also  negatively affect the cholesterol profile, although that is a minor effect. One out of 5 women on aromatase inhibitors we will feel "old and achy". This arthralgia/myalgia syndrome, which resembles fibromyalgia clinically, does resolve with stopping the medications. Accordingly this is not a reason to not try an aromatase inhibitor but  it is a frequent reason to stop it (in other words 20% of women will not be able to tolerate these medications).  Tamoxifen on the other hand does not block estrogen production. It does not "take away a woman's estrogen". It blocks the estrogen receptor in breast cells. Like anastrozole, it can also cause hot flashes. As opposed to anastrozole, tamoxifen has many estrogen-like effects. It is technically an estrogen receptor modulator. This means that in some tissues tamoxifen works like estrogen-- for example it helps strengthen the bones. It tends to improve the cholesterol profile. It can cause thickening of the endometrial lining, and even endometrial polyps or rarely cancer of the uterus.(The risk of uterine cancer due to tamoxifen is one additional cancer per thousand women year). It can cause vaginal wetness or stickiness. It can cause blood clots through this estrogen-like effect--the risk of blood clots with tamoxifen is exactly the same as with birth control pills or hormone replacement.  Neither of these agents causes mood changes or weight gain, despite the popular belief that they can have these side effects. We have data from studies comparing either of these drugs with placebo, and in those cases the control group had the same amount of weight gain and depression as the group that took the drug.  After this discussion we decided to start tamoxifen and I have gone ahead and placed the prescription.  We discussed weight gain issues which is a major concern to [facility] and she understands that there is an 18 pound average weight gain with menopause due to decreased  metabolism chiefly but that tamoxifen does not seem to contribute to that.  She is doing a good job at improving her metabolic rate with her exercise program.  She is aware that alcohol not only increases weight but also can increase the risk of cancer including breast cancer  Tentatively I have made her a return appointment with me in 3 months.  From that point if everything is going well we will broaden the follow-up interval.  I also placed a referral to physical therapy to discuss and prophylax against lymphedema.  She knows to call for any other issue that may develop before then  Total encounter time 45 minutes.Chauncey Cruel, MD   11/22/2019 4:35 PM Medical Oncology and Hematology [facility] [place], [place] [address] Tel. [phone]    Fax. [phone]   This document serves as a record of services personally performed by Lurline Del, MD. It was created on his behalf by Wilburn Mylar, a trained medical scribe. The creation of this record is based on the scribe's personal observations and the provider's statements to them.   I, Lurline Del MD, have reviewed the above documentation for accuracy and completeness, and I agree with the above.   *Total Encounter Time as defined by the Centers for Medicare and Medicaid Services includes, in addition to the face-to-face time of a patient visit (documented in the note above) non-face-to-face time: obtaining and reviewing outside history, ordering and reviewing medications, tests or procedures, care coordination (communications with other health care professionals or caregivers) and documentation in the medical record.

## 2019-11-22 ENCOUNTER — Other Ambulatory Visit: Payer: Self-pay

## 2019-11-22 ENCOUNTER — Inpatient Hospital Stay: Payer: 59 | Attending: Oncology | Admitting: Oncology

## 2019-11-22 VITALS — BP 120/56 | HR 61 | Temp 98.2°F | Resp 18 | Ht 65.0 in | Wt 128.1 lb

## 2019-11-22 DIAGNOSIS — C50512 Malignant neoplasm of lower-outer quadrant of left female breast: Secondary | ICD-10-CM

## 2019-11-22 DIAGNOSIS — Z923 Personal history of irradiation: Secondary | ICD-10-CM | POA: Diagnosis not present

## 2019-11-22 DIAGNOSIS — I10 Essential (primary) hypertension: Secondary | ICD-10-CM | POA: Insufficient documentation

## 2019-11-22 DIAGNOSIS — Z803 Family history of malignant neoplasm of breast: Secondary | ICD-10-CM | POA: Diagnosis not present

## 2019-11-22 DIAGNOSIS — Z9221 Personal history of antineoplastic chemotherapy: Secondary | ICD-10-CM | POA: Diagnosis not present

## 2019-11-22 DIAGNOSIS — Z79899 Other long term (current) drug therapy: Secondary | ICD-10-CM | POA: Insufficient documentation

## 2019-11-22 DIAGNOSIS — Z170 Estrogen receptor positive status [ER+]: Secondary | ICD-10-CM | POA: Insufficient documentation

## 2019-11-22 DIAGNOSIS — Z79810 Long term (current) use of selective estrogen receptor modulators (SERMs): Secondary | ICD-10-CM | POA: Diagnosis not present

## 2019-11-22 MED ORDER — TAMOXIFEN CITRATE 20 MG PO TABS: 20.0000 mg | ORAL_TABLET | Freq: Every day | ORAL | 12 refills | Status: AC

## 2019-11-24 ENCOUNTER — Telehealth: Payer: Self-pay | Admitting: Oncology

## 2019-11-24 NOTE — Telephone Encounter (Signed)
Scheduled appts per 5/3 los. Pt confirmed appt date and time.

## 2019-11-29 ENCOUNTER — Other Ambulatory Visit: Payer: Self-pay

## 2019-12-01 ENCOUNTER — Ambulatory Visit: Payer: 59 | Admitting: Obstetrics and Gynecology

## 2019-12-02 ENCOUNTER — Encounter: Payer: Self-pay | Admitting: Obstetrics and Gynecology

## 2019-12-02 ENCOUNTER — Other Ambulatory Visit: Payer: Self-pay

## 2019-12-02 ENCOUNTER — Ambulatory Visit (INDEPENDENT_AMBULATORY_CARE_PROVIDER_SITE_OTHER): Payer: 59 | Admitting: Obstetrics and Gynecology

## 2019-12-02 VITALS — BP 120/78

## 2019-12-02 DIAGNOSIS — Z124 Encounter for screening for malignant neoplasm of cervix: Secondary | ICD-10-CM

## 2019-12-02 DIAGNOSIS — R87810 Cervical high risk human papillomavirus (HPV) DNA test positive: Secondary | ICD-10-CM

## 2019-12-02 NOTE — Progress Notes (Signed)
   Amy Fisher [birth year] [id number]  SUBJECTIVE:  The patient is a 59 y.o. G0P0 who presents today for a repeat Pap smear.  The result has returned with insufficient cells at her last two attempts.  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  There was a note of partially obscuring inflammation so she was told to return in 3 months for repeat Pap smear test.  Allergies: Patient has no known allergies.  Patient's last menstrual period was 03/31/2015.  Past medical history,surgical history, problem list, medications, allergies, family history and social history were all reviewed and documented as reviewed in the EPIC chart.  OBJECTIVE:  BP 120/78   LMP 03/31/2015  The patient appears well, alert, oriented x 3, in no distress. PELVIC EXAM: VULVA: normal appearing vulva with no masses, tenderness or lesions, VAGINA: normal appearing vagina with normal color and discharge, no lesions, CERVIX: normal appearing cervix without discharge or lesions, deviated to patient's left and flush with vagina  Chaperone: Caryn Bee present during the examination  ASSESSMENT:  59 y.o. G0P0 here for a Pap smear only exam  PLAN:  We discussed the HPV test positive results.  Reassuring that subtype 16, 18, 45 was negative.  Important to see the cytology a positive HPV test, so did have the third attempt at Pap smear collection today and should have a sufficient sample.  We will communicate the results of the Pap smear to the patient as soon as possible.  Joseph Pierini MD 12/02/19

## 2019-12-02 NOTE — Addendum Note (Signed)
Addended by: Nelva Nay on: 12/02/2019 11:46 AM   Modules accepted: Orders

## 2019-12-06 ENCOUNTER — Ambulatory Visit: Payer: 59 | Attending: Oncology | Admitting: Rehabilitation

## 2019-12-06 ENCOUNTER — Other Ambulatory Visit: Payer: Self-pay

## 2019-12-06 ENCOUNTER — Encounter: Payer: Self-pay | Admitting: Rehabilitation

## 2019-12-06 DIAGNOSIS — L599 Disorder of the skin and subcutaneous tissue related to radiation, unspecified: Secondary | ICD-10-CM

## 2019-12-06 DIAGNOSIS — Z483 Aftercare following surgery for neoplasm: Secondary | ICD-10-CM | POA: Insufficient documentation

## 2019-12-06 LAB — PAP IG W/ RFLX HPV ASCU

## 2019-12-06 NOTE — Therapy (Deleted)
[place], [place], [address] Phone: [phone]   Fax:  [phone]  Physical Therapy Treatment  Patient Details  Name: Amy Fisher MRN: [id number] Date of Birth: [birth year] Referring Provider (PT): Dr. Jana Hakim   Encounter Date: 12/06/2019  PT End of Session - 12/06/19 2321    Visit Number  1    Number of Visits  1    PT Start Time  1100    PT Stop Time  1158    PT Time Calculation (min)  58 min    Activity Tolerance  Patient tolerated treatment well    Behavior During Therapy  [facility] for tasks assessed/performed       Past Medical History:  Diagnosis Date  . Asthma   . Cancer ([place])    Breast  . Cervical dysplasia    lgsil 2008, ascus 2009, normal Pap smears afterwards  . Fibroid   . Hypertension     Past Surgical History:  Procedure Laterality Date  . BREAST LUMPECTOMY WITH RADIOACTIVE SEED AND SENTINEL LYMPH NODE BIOPSY Left 08/31/2019   Procedure: LEFT BREAST LUMPECTOMY WITH RADIOACTIVE SEED AND LEFT AXILLARY SENTINEL LYMPH NODE BIOPSY;  Surgeon: Erroll Luna, MD;  Location: [facility];  Service: General;  Laterality: Left;  . BREAST SURGERY     Breast Biopsy  . IUD REMOVAL  07/2017   Mirena  . PILONIDAL CYST EXCISION    . RE-EXCISION OF BREAST LUMPECTOMY Left 09/09/2019   Procedure: LEFT BREAST RE-EXCISION OF LUMPECTOMY AND EVACUATION OF LEFT AXILLARY SEROMA;  Surgeon: Erroll Luna, MD;  Location: [facility];  Service: General;  Laterality: Left;    There were no vitals filed for this visit.  Subjective Assessment - 12/06/19 2314    Subjective  I am doing really well    Pertinent History  Lt lumpectomy by Dr. [name] on 08/31/19 with 8 negative lymph nodes due to invasive lobular carcinoma grade 2.  Radiation completed. HTN    Patient Stated Goals  Am I doing Okay?    Currently in Pain?  No/denies         [facility] PT Assessment - 12/06/19 0001       Assessment   Medical Diagnosis  left breast cancer    Referring Provider (PT)  Dr. 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    Leisure  peloton classes weight lifting, bike and treadmill      Cognition   Overall Cognitive Status  Within Functional Limits for tasks assessed      Observation/Other Assessments   Observations  No edema evident, skin healed well       Sensation   Additional Comments  --      Coordination   Gross Motor Movements are Fluid and Coordinated  Yes      Posture/Postural Control   Posture/Postural Control  No significant limitations      ROM / Strength   AROM / PROM / Strength  AROM;PROM;Strength      AROM   Overall AROM Comments  history of frozen shoulder of the left     AROM Assessment Site  Shoulder    Right/Left Shoulder  Right;Left    Right Shoulder Extension  60 Degrees    Right Shoulder Flexion  157 Degrees    Right Shoulder ABduction  165 Degrees    Right Shoulder Internal Rotation  80 Degrees    Right Shoulder External Rotation  100 Degrees    Right Shoulder Horizontal ABduction  40 Degrees    Left Shoulder Extension  55 Degrees    Left Shoulder Flexion  153 Degrees   pull   Left Shoulder ABduction  165 Degrees    Left Shoulder Internal Rotation  75 Degrees    Left Shoulder External Rotation  100 Degrees   pull   Left Shoulder Horizontal ABduction  32 Degrees   pull     PROM   PROM Assessment Site  Shoulder    Right/Left Shoulder  Left    Left Shoulder Flexion  160 Degrees     Left Shoulder ABduction  165 Degrees        LYMPHEDEMA/ONCOLOGY QUESTIONNAIRE - 12/06/19 1128      Type   Cancer Type  Left breast      Surgeries   Lumpectomy Date  08/31/19    Sentinel Lymph Node Biopsy Date  08/31/19    Number Lymph Nodes Removed  8      Treatment   Active Chemotherapy Treatment  No    Past Chemotherapy Treatment  No    Active Radiation Treatment  No    Past Radiation Treatment  Yes    Current Hormone Treatment  Yes      What other symptoms do you have   Are you Having Heaviness or Tightness  No    Are you having Pain  No      Lymphedema Assessments   Lymphedema Assessments  Upper extremities      Right Upper Extremity Lymphedema   10 cm Proximal to Olecranon Process  25.7 cm    Olecranon Process  23.8 cm    10 cm Proximal to Ulnar Styloid Process  19.2 cm    Just Proximal to Ulnar Styloid Process  15.5 cm    Across Hand at [employer]  18.5 cm    At [place] of 2nd Digit  6.3 cm      Left Upper Extremity Lymphedema   10 cm Proximal to Olecranon Process  26 cm    Olecranon Process  23.6 cm    10 cm Proximal to Ulnar Styloid Process  19.1 cm    Just Proximal to Ulnar Styloid Process  15.2 cm    Across Hand at [employer]  18 cm    At [place] of 2nd Digit  6.2 cm      L-DEX FLOWSHEETS - 12/06/19 1100      L-DEX LYMPHEDEMA SCREENING   Measurement Type  Unilateral    L-DEX MEASUREMENT EXTREMITY  Upper Extremity    POSITION   Standing    DOMINANT SIDE  Right    At Risk Side  Left    BASELINE SCORE (UNILATERAL)  -2.7                        PT Education -  12/06/19 2320    Education Details  new stretches, SOZO score and testing, POC, risk reduction    Person(s) Educated  Patient    Methods  Explanation;Demonstration;Handout    Comprehension  Verbalized understanding;Verbal cues required          PT Long Term Goals - 12/06/19 2327      PT LONG TERM GOAL #1   Title  Pt will be educated on use of SOZO for lymphedema  detection    Time  1    Period  Days    Status  New      PT LONG TERM GOAL #2   Title  Pt will be educated on risk reduction for lymphedema    Time  1    Period  Days    Status  New      PT LONG TERM GOAL #3   Title  Pt will be ind with HEP for continued mobility    Time  1    Period  Days    Status  Achieved            Plan - 12/06/19 2322    Clinical Impression Statement  Pt presents post left lumpectomy with 8 negative lymph nodes doing very well post surgery and radiation.  Pt has some limited end range motion but has been able to transition back to running, cycling, and lifting weights without issues.  Pt was given advanced stretches to include to gain the last of the motion, was educated on lymphedema and risk reduction, use of compression, and when to return to PT    Personal Factors and Comorbidities  Comorbidity 2    Comorbidities  SLNB, radiation    Stability/Clinical Decision Making  Stable/Uncomplicated    Clinical Decision Making  Low    Rehab Potential  Excellent    PT Frequency  One time visit    PT Treatment/Interventions  ADLs/Self Care Home Management;Therapeutic exercise;Patient/family education    Consulted and Agree with Plan of Care  Patient       Patient will benefit from skilled therapeutic intervention in order to improve the following deficits and impairments:  Decreased range of motion  Visit Diagnosis: Aftercare following surgery for neoplasm  Disorder of the skin and subcutaneous tissue related to radiation, unspecified     Problem List Patient Active Problem List   Diagnosis Date Noted  . Genetic testing 08/19/2019  . Family history of breast cancer   . Malignant neoplasm of lower-outer quadrant of left breast of female, estrogen receptor positive ([place]) 08/04/2019    Stark Bray 12/06/2019, 11:29 PM  [place], [place], [address] Phone: [phone]    Fax:  [phone]  Name: HARSHITHA MARCENGILL MRN: [id number] Date of Birth: Apr 26, 1961

## 2019-12-06 NOTE — Therapy (Signed)
[place], [place], [address] Phone: [phone]   Fax:  [phone]  Physical Therapy Evaluation  Patient Details  Name: Amy Fisher MRN: [id number] Date of Birth: 1961-04-08 Referring Provider (PT): Dr. Jana Hakim   Encounter Date: 12/06/2019  PT End of Session - 12/06/19 2321    Visit Number  1    Number of Visits  1    PT Start Time  1100    PT Stop Time  1158    PT Time Calculation (min)  58 min    Activity Tolerance  Patient tolerated treatment well    Behavior During Therapy  [facility] for tasks assessed/performed       Past Medical History:  Diagnosis Date  . Asthma   . Cancer ([place])    Breast  . Cervical dysplasia    lgsil 2008, ascus 2009, normal Pap smears afterwards  . Fibroid   . Hypertension     Past Surgical History:  Procedure Laterality Date  . BREAST LUMPECTOMY WITH RADIOACTIVE SEED AND SENTINEL LYMPH NODE BIOPSY Left 08/31/2019   Procedure: LEFT BREAST LUMPECTOMY WITH RADIOACTIVE SEED AND LEFT AXILLARY SENTINEL LYMPH NODE BIOPSY;  Surgeon: Erroll Luna, MD;  Location: [facility];  Service: General;  Laterality: Left;  . BREAST SURGERY     Breast Biopsy  . IUD REMOVAL  07/2017   Mirena  . PILONIDAL CYST EXCISION    . RE-EXCISION OF BREAST LUMPECTOMY Left 09/09/2019   Procedure: LEFT BREAST RE-EXCISION OF LUMPECTOMY AND EVACUATION OF LEFT AXILLARY SEROMA;  Surgeon: Erroll Luna, MD;  Location: [facility];  Service: General;  Laterality: Left;    There were no vitals filed for this visit.   Subjective Assessment - 12/06/19 2314    Subjective  I am doing really well    Pertinent History  Lt lumpectomy by Dr. [name] on 08/31/19 with 8 negative lymph nodes due to invasive lobular carcinoma grade 2.  Radiation completed. HTN    Patient Stated Goals  Am I doing Okay?    Currently in Pain?  No/denies         [facility] PT Assessment - 12/06/19 0001       Assessment   Medical Diagnosis  left breast cancer    Referring Provider (PT)  Dr. 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    Leisure  peloton classes weight lifting, bike and treadmill      Cognition   Overall Cognitive Status  Within Functional Limits for tasks assessed      Observation/Other Assessments   Observations  No edema evident, skin healed well       Sensation   Additional Comments  --      Coordination   Gross Motor Movements are Fluid and Coordinated  Yes      Posture/Postural Control   Posture/Postural  Control  No significant limitations      ROM / Strength   AROM / PROM / Strength  AROM;PROM;Strength      AROM   Overall AROM Comments  history of frozen shoulder of the left     AROM Assessment Site  Shoulder    Right/Left Shoulder  Right;Left    Right Shoulder Extension  60 Degrees    Right Shoulder Flexion  157 Degrees    Right Shoulder ABduction  165 Degrees    Right Shoulder Internal Rotation  80 Degrees    Right Shoulder External Rotation  100 Degrees    Right Shoulder Horizontal ABduction  40 Degrees    Left Shoulder Extension  55 Degrees    Left Shoulder Flexion  153 Degrees   pull   Left Shoulder ABduction  165 Degrees    Left Shoulder Internal Rotation  75 Degrees    Left Shoulder External Rotation  100 Degrees   pull   Left Shoulder Horizontal ABduction  32 Degrees   pull     PROM   PROM Assessment Site  Shoulder    Right/Left Shoulder  Left    Left Shoulder Flexion  160 Degrees     Left Shoulder ABduction  165 Degrees        LYMPHEDEMA/ONCOLOGY QUESTIONNAIRE - 12/06/19 1128      Type   Cancer Type  Left breast      Surgeries   Lumpectomy Date  08/31/19    Sentinel Lymph Node Biopsy Date  08/31/19    Number Lymph Nodes Removed  8      Treatment   Active Chemotherapy Treatment  No    Past Chemotherapy Treatment  No    Active Radiation Treatment  No    Past Radiation Treatment  Yes    Current Hormone Treatment  Yes      What other symptoms do you have   Are you Having Heaviness or Tightness  No    Are you having Pain  No      Lymphedema Assessments   Lymphedema Assessments  Upper extremities      Right Upper Extremity Lymphedema   10 cm Proximal to Olecranon Process  25.7 cm    Olecranon Process  23.8 cm    10 cm Proximal to Ulnar Styloid Process  19.2 cm    Just Proximal to Ulnar Styloid Process  15.5 cm    Across Hand at [employer]  18.5 cm    At [place] of 2nd Digit  6.3 cm      Left Upper Extremity Lymphedema   10 cm Proximal to Olecranon Process  26 cm    Olecranon Process  23.6 cm    10 cm Proximal to Ulnar Styloid Process  19.1 cm    Just Proximal to Ulnar Styloid Process  15.2 cm    Across Hand at [employer]  18 cm    At [place] of 2nd Digit  6.2 cm      L-DEX FLOWSHEETS - 12/06/19 1100      L-DEX LYMPHEDEMA SCREENING   Measurement Type  Unilateral    L-DEX MEASUREMENT EXTREMITY  Upper Extremity    POSITION   Standing    DOMINANT SIDE  Right    At Risk Side  Left    BASELINE SCORE (UNILATERAL)  -2.7              Objective measurements completed on examination: See above findings.  PT Education - 12/06/19 2320    Education Details  new stretches, SOZO score and testing, POC, risk reduction    Person(s) Educated  Patient    Methods  Explanation;Demonstration;Handout    Comprehension  Verbalized understanding;Verbal cues required          PT Long Term Goals - 12/06/19 2327      PT LONG  TERM GOAL #1   Title  Pt will be educated on use of SOZO for lymphedema detection    Time  1    Period  Days    Status  New      PT LONG TERM GOAL #2   Title  Pt will be educated on risk reduction for lymphedema    Time  1    Period  Days    Status  New      PT LONG TERM GOAL #3   Title  Pt will be ind with HEP for continued mobility    Time  1    Period  Days    Status  Achieved             Plan - 12/06/19 2322    Clinical Impression Statement  Pt presents post left lumpectomy with 8 negative lymph nodes doing very well post surgery and radiation.  Pt has some limited end range motion but has been able to transition back to running, cycling, and lifting weights without issues.  Pt was given advanced stretches to include to gain the last of the motion, was educated on lymphedema and risk reduction, use of compression, and when to return to PT    Personal Factors and Comorbidities  Comorbidity 2    Comorbidities  SLNB, radiation    Stability/Clinical Decision Making  Stable/Uncomplicated    Clinical Decision Making  Low    Rehab Potential  Excellent    PT Frequency  One time visit    PT Treatment/Interventions  ADLs/Self Care Home Management;Therapeutic exercise;Patient/family education    Consulted and Agree with Plan of Care  Patient       Patient will benefit from skilled therapeutic intervention in order to improve the following deficits and impairments:  Decreased range of motion  Visit Diagnosis: Aftercare following surgery for neoplasm  Disorder of the skin and subcutaneous tissue related to radiation, unspecified     Problem List Patient Active Problem List   Diagnosis Date Noted  . Genetic testing 08/19/2019  . Family history of breast cancer   . Malignant neoplasm of lower-outer quadrant of left breast of female, estrogen receptor positive ([place]) 08/04/2019    Shan Levans, PT 12/06/2019, 11:30 PM  [place], [place], [address] Phone: [phone]   Fax:  [phone]  Name: Amy Fisher MRN: [id number] Date of Birth: 1960-11-09

## 2019-12-08 NOTE — Progress Notes (Incomplete)
  Patient Name: Amy Fisher MRN: [id number] DOB: 1960-09-29 Referring Physician: Orpah Melter (Profile Not Attached) Date of Service: 11/10/2019 [facility], [place]                                                        End Of Treatment Note  Diagnoses: C50.512-Malignant neoplasm of lower-outer quadrant of left female breast  Cancer Staging: Stage IA  (pT1c, pN0) Left Breast LOQ, Invasive Lobular Carcinoma, ER+ / PR+ / Her2-, Grade 2  Intent: Curative  Radiation Treatment Dates: 10/14/2019 through 11/10/2019 Site Technique Total Dose (Gy) Dose per Fx (Gy) Completed Fx Beam Energies  Breast, Left: Breast_Lt_Bst 3D 10/10 2 5/5 6X  Breast, Left: Breast_Lt 3D 40.05/40.05 2.67 15/15 6X   Narrative: The patient tolerated radiation therapy relatively well. She did not report any fatigue. She did report some mild pain associated with radiation dermatitis on chest. As treatment continued, the left breast was noted to show hyperpigmentation changes and mild erythema with some radiation dermatitis noted in the upper inner aspect of the breast. No skin breakdown.  Plan: The patient will follow-up with radiation oncology in one month.  ________________________________________________   Blair Promise, PhD, MD  This document serves as a record of services personally performed by Gery Pray, MD. It was created on his behalf by Clerance Lav, a trained medical scribe. The creation of this record is based on the scribe's personal observations and the provider's statements to them. This document has been checked and approved by the attending provider.

## 2019-12-14 ENCOUNTER — Telehealth: Payer: Self-pay | Admitting: *Deleted

## 2019-12-14 NOTE — Telephone Encounter (Signed)
CALLED PATIENT TO RESCHEDULE FU APPT. FOR 12-16-19 DUE TO DR. KINARD BEING IN THE OR, RESCHEDULED FOR 12-23-19 @ 9:15 AM, SPOKE WITH PATIENT AND SHE AGREED TO THIS

## 2019-12-16 ENCOUNTER — Ambulatory Visit: Payer: Self-pay | Admitting: Radiation Oncology

## 2019-12-22 NOTE — Progress Notes (Signed)
°Radiation Oncology         [phone] °________________________________ ° °Name: Amy Fisher MRN: [id number]  °Date: 12/23/2019  DOB: 01/21/1961 ° °Follow-Up Visit Note ° °CC: Meyers, Stephen, MD  Magrinat, Gustav C, MD ° °  ICD-10-CM   °1. Malignant neoplasm of lower-outer quadrant of left breast of female, estrogen receptor positive ([facility])  C50.512   ° Z17.0   ° ° °Diagnosis: Stage IA  (pT1c, pN0) Left Breast LOQ, Invasive Lobular Carcinoma, ER+ / PR+ / Her2-, Grade 2 ° °Interval Since Last Radiation: One month, one week, and six days. ° °Radiation Treatment Dates: 10/14/2019 through 11/10/2019 °Site Technique Total Dose (Gy) Dose per Fx (Gy) Completed Fx Beam Energies  °Breast, Left: Breast_Lt_Bst 3D 10/10 2 5/5 6X  °Breast, Left: Breast_Lt 3D 40.05/40.05 2.67 15/15 6X  ° ° °Narrative:  The patient returns today for routine follow-up. She was last seen by Dr. Magrinat on 11/22/2019, during which time they discussed her favorable Oncotype results. Given that, chemotherapy would not improve her already good prognosis. The patient agreed to begin antiestrogen therapy with Tamoxifen for five years. ° °Of note, the patient had a normal PAP smear on 12/02/2019. °                       °On review of systems, she reports left breast sensitivity. She denies fatigue, lymphedema, and skin irritation. ° °ALLERGIES:  has No Known Allergies. ° °Meds: °Current Outpatient Medications  °Medication Sig Dispense Refill  °• albuterol (VENTOLIN HFA) 108 (90 Base) MCG/ACT inhaler Inhale 2 puffs into the lungs every 6 (six) hours as needed for wheezing or shortness of breath.     °• ascorbic acid (VITAMIN C) 500 MG tablet Take 500 mg by mouth daily.    °• benazepril (LOTENSIN) 10 MG tablet Take 10 mg by mouth daily.    °• cholecalciferol (VITAMIN D3) 25 MCG (1000 UNIT) tablet Take 1,000 Units by mouth daily.    °• ibuprofen (ADVIL) 200 MG tablet Take by mouth.    °• Melatonin 10 MG TABS Take 10-20 mg by mouth at bedtime as needed  (sleep).    °• Multiple Vitamin (MULTIVITAMIN) tablet Take 1 tablet by mouth daily.    °• tamoxifen (NOLVADEX) 10 MG tablet Take 10 mg by mouth 2 (two) times daily.    °• vitamin B-12 (CYANOCOBALAMIN) 1000 MCG tablet Take 1,000 mcg by mouth daily.    °• WIXELA INHUB 250-50 MCG/DOSE AEPB Inhale 1 puff into the lungs 2 (two) times daily.    ° °No current facility-administered medications for this encounter.  ° ° °Physical Findings: °The patient is in no acute distress. Patient is alert and oriented. ° height is 5' 5" (1.651 m) and weight is 124 lb 12.8 oz (56.6 kg). Her temperature is 97.6 °F (36.4 °C). Her blood pressure is 117/71 and her pulse is 70. Her respiration is 18 and oxygen saturation is 100%.   °Lungs are clear to auscultation bilaterally. Heart has regular rate and rhythm. No palpable cervical, supraclavicular, or axillary adenopathy. Abdomen soft, non-tender, normal bowel sounds. °Right breast: no Palpable mass nipple discharge or bleeding °Left breast: Patient skin is healed well.  Mild hyperpigmentation changes.  Some edema in the central aspect of the breast.  No dominant mass appreciated no nipple discharge or bleeding. mild Architectural distortion noted at the nipple area ° °Lab Findings: °Lab Results  °Component Value Date  ° WBC 4.0 08/27/2019  ° HGB 14.7 08/27/2019  °   HCT 46.7 (H) 08/27/2019   MCV 95.3 08/27/2019   PLT 172 08/27/2019    Radiographic Findings: No results found.  Impression: StageIA(pT1c, pN0) Left Breast LOQ, Invasive Lobular Carcinoma, ER+ / PR+ / Her2-, Grade 2  The patient is recovering from the effects of radiation.  No evidence of recurrence on clinical exam  Plan: The patient is scheduled to see Dr. Jana Hakim on 03/01/2020. Follow-up with radiation oncology on as-needed basis in light of her close follow-up with medical oncology.  ____________________________________   Blair Promise, PhD, MD  This document serves as a record of services personally  performed by Gery Pray, MD. It was created on his behalf by Clerance Lav, a trained medical scribe. The creation of this record is based on the scribe's personal observations and the provider's statements to them. This document has been checked and approved by the attending provider.

## 2019-12-23 ENCOUNTER — Encounter: Payer: Self-pay | Admitting: Radiation Oncology

## 2019-12-23 ENCOUNTER — Other Ambulatory Visit: Payer: Self-pay

## 2019-12-23 ENCOUNTER — Ambulatory Visit
Admission: RE | Admit: 2019-12-23 | Discharge: 2019-12-23 | Disposition: A | Discharge status: Home / Self Care | Payer: 59 | Source: Ambulatory Visit | Attending: Radiation Oncology | Admitting: Radiation Oncology

## 2019-12-23 DIAGNOSIS — C50512 Malignant neoplasm of lower-outer quadrant of left female breast: Secondary | ICD-10-CM | POA: Diagnosis present

## 2019-12-23 DIAGNOSIS — Z170 Estrogen receptor positive status [ER+]: Secondary | ICD-10-CM | POA: Diagnosis not present

## 2019-12-23 DIAGNOSIS — Z923 Personal history of irradiation: Secondary | ICD-10-CM | POA: Diagnosis not present

## 2019-12-23 DIAGNOSIS — Z79899 Other long term (current) drug therapy: Secondary | ICD-10-CM | POA: Insufficient documentation

## 2019-12-23 NOTE — Progress Notes (Signed)
Patient here for a f/u visit with Dr. Sondra Come.  She reports sensitivity in her left breast. Started Tamoxifen 5/10//21.  No fatigue  No skin issues  No ROM probs or lymphedema.  Uses a moisturizer on her left breast.  Today is her 1 month f/u.  BP 117/71 (BP Location: Right Arm, Patient Position: Sitting, Cuff Size: Normal)   Pulse 70   Temp 97.6 F (36.4 C)   Resp 18   Ht 5\' 5"  (1.651 m)   Wt 124 lb 12.8 oz (56.6 kg)   LMP 03/31/2015   SpO2 100%   BMI 20.77 kg/m     Wt Readings from Last 3 Encounters:  12/23/19 124 lb 12.8 oz (56.6 kg)  11/22/19 128 lb 1.6 oz (58.1 kg)  10/07/19 129 lb (58.5 kg)

## 2020-02-22 NOTE — Progress Notes (Signed)
[place]  Telephone:([phone]) [phone] Fax:(336) [phone]     ID: CANARY FISTER DOB: [birth year]  MR#: [id number]  PTW#:[id number]  Patient Care Team: Orpah Melter, MD as PCP - General (Family Medicine) Mauro Kaufmann, RN as Oncology Nurse Navigator [name], RN as Oncology Nurse Navigator Alphonsa Overall, MD as Consulting Physician (General Surgery) Dent Plantz, Virgie Dad, MD as Consulting Physician (Oncology) Gery Pray, MD as Consulting Physician (Radiation Oncology) Chauncey Cruel, MD OTHER MD:   CHIEF COMPLAINT: Estrogen receptor positive lobular breast cancer  CURRENT TREATMENT: tamoxifen   INTERVAL HISTORY: [name] returns today for follow up of her estrogen receptor positive lobular breast cancer.  She started tamoxifen at her last visit on 11/22/2019.  She is tolerating it well, with a few hot flashes which she thinks are not really different from before, and no vaginal wetness.  She is obtaining it at no cost.   REVIEW OF SYSTEMS: Shatona is concerned about weight gain.  She thinks the tamoxifen might be doing it but of course it is more likely the perimenopausal status.  She drinks 2 cups of wine every night and wonders if that is too much.  She is exercising quite a bit, using her bike, treadmill and weights just about daily and then walking on days when she does not do that.  Aside from these issues a detailed review of systems today was stable   HISTORY OF CURRENT ILLNESS: From the original intake note:  Amy Fisher had routine screening mammography on 07/07/2019 showing a possible abnormality in the left breast. She underwent left diagnostic mammography with tomography and left breast ultrasonography at [facility] on 07/14/2019 showing: breast density category C; irregular 1.5 cm mass at 4 o'clock in the left breast with a 0.4 cm adjacent satellite nodule; no left axillary adenopathy. Physical exam showed a 5 mm blue domed cyst in  the left breast a 4 o'clock, palpable as a BB size superficial mass and a discrete palpable 2 cm mass approximately 2 cm below the previous.  Accordingly on 07/22/2019 she proceeded to biopsy of the left breast area in question. The pathology from this procedure ([id number]) showed: invasive mammary carcinoma, grade 2, e-cadherin negative. Prognostic indicators significant for: estrogen receptor, 100% positive and progesterone receptor, 95% positive, both with strong staining intensity. Proliferation marker Ki67 at 2%. HER2 equivocal by immunohistochemistry (2+), but negative by fluorescent in situ hybridization with a signals ratio 1.05 and number per cell 2.00.  The patient's subsequent history is as detailed below.   PAST MEDICAL HISTORY: Past Medical History:  Diagnosis Date  . Asthma   . Cancer ([place])    Breast  . Cervical dysplasia    lgsil 2008, ascus 2009, normal Pap smears afterwards  . Fibroid   . Hypertension   Exercise-induced asthma, likely bilateral early carpal tunnel   PAST SURGICAL HISTORY: Past Surgical History:  Procedure Laterality Date  . BREAST LUMPECTOMY WITH RADIOACTIVE SEED AND SENTINEL LYMPH NODE BIOPSY Left 08/31/2019   Procedure: LEFT BREAST LUMPECTOMY WITH RADIOACTIVE SEED AND LEFT AXILLARY SENTINEL LYMPH NODE BIOPSY;  Surgeon: Erroll Luna, MD;  Location: [facility];  Service: General;  Laterality: Left;  . BREAST SURGERY     Breast Biopsy  . IUD REMOVAL  07/2017   Mirena  . PILONIDAL CYST EXCISION    . RE-EXCISION OF BREAST LUMPECTOMY Left 09/09/2019   Procedure: LEFT BREAST RE-EXCISION OF LUMPECTOMY AND EVACUATION OF LEFT AXILLARY SEROMA;  Surgeon: Cornett,  [name], MD;  Location: [facility];  Service: General;  Laterality: Left;    FAMILY HISTORY: Family History  Problem Relation Age of Onset  . Cancer Maternal Grandmother        ? type  . Breast cancer Mother 80  . Celiac disease Sister   . Alzheimer's disease  Father   . Cancer Paternal Aunt        ? type  . Alzheimer's disease Paternal Grandfather   . Breast cancer Other        dx70s   Patient has 1 sister, 49, and 1 brother, 67, with no history of cancer. No cancers in her 3 nephews.  [name] mother was diagnosed with breast cancer at 99 and is living at 58. Patient has 1 maternal uncle, no cancers. No known cancers for her 2 female cousins. Patient's maternal grandmother did have cancer but is unsure the type. Maternal grandmother's sister did have breast cancer in her 82s. Maternal grandfather died in his 65s due to heart issues.  [name] father died at 3 due to Alzheimer's. Patient had 1 paternal aunt, she had cancer but unsure type. No cancers for her 3 paternal female cousins. Paternal grandparents both died in their 87s.  Ms. Pangle is unaware of previous family history of genetic testing for hereditary cancer risks. Patient's maternal ancestors are of Caucasian descent, and paternal ancestors are of Caucasian descent. There is no reported Ashkenazi Jewish ancestry. There is no known consanguinity.   GYNECOLOGIC HISTORY:  Patient's last menstrual period was 03/31/2015. Menarche: 59 years old [place] P 1 LMP 2019 Contraceptive: history of Mirena IUD (removed 07/2017) HRT no Hysterectomy? no BSO? 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 She is divorced and lives by herself.  She had a son at age 60 whom she gave up for adoption but she has since contacted him.[name] is currently 80 and is a Geneticist, molecular.  The patient has 1 grandchild.  She attends a local [employer] DIRECTIVES: Not in place; she tells me she plans to name her Sister Barrett Henle as healthcare power of attorney   HEALTH MAINTENANCE: Social History   Tobacco Use  . Smoking status:  Never Smoker  . Smokeless tobacco: Never Used  Vaping Use  . Vaping Use: Never used  Substance Use Topics  . Alcohol use: Yes    Alcohol/week: 0.0 standard drinks    Comment: OCC, WINE  . Drug use: No     Colonoscopy: 2016  PAP: 11/2019, negative  Bone density:    No Known Allergies  Current Outpatient Medications  Medication Sig Dispense Refill  . albuterol (VENTOLIN HFA) 108 (90 Base) MCG/ACT inhaler Inhale 2 puffs into the lungs every 6 (six) hours as needed for wheezing or shortness of breath.     Marland Kitchen ascorbic acid (VITAMIN C) 500 MG tablet Take 500 mg by mouth daily.    . benazepril (LOTENSIN) 10 MG tablet Take 10 mg by mouth daily.    . cholecalciferol (VITAMIN D3) 25 MCG (1000 UNIT) tablet Take 1,000 Units by mouth daily.    Marland Kitchen ibuprofen (ADVIL) 200 MG tablet Take by mouth.    . Melatonin 10 MG TABS Take 10-20 mg by mouth at bedtime as needed (sleep).    . Multiple Vitamin (MULTIVITAMIN) tablet Take 1 tablet  by mouth daily.    . tamoxifen (NOLVADEX) 10 MG tablet Take 10 mg by mouth 2 (two) times daily.    . vitamin B-12 (CYANOCOBALAMIN) 1000 MCG tablet Take 1,000 mcg by mouth daily.    Grant Ruts INHUB 250-50 MCG/DOSE AEPB Inhale 1 puff into the lungs 2 (two) times daily.     No current facility-administered medications for this visit.    OBJECTIVE: Middle-aged white woman who appears well  Vitals:   02/23/20 1133  BP: 136/78  Pulse: (!) 59  Resp: 20  Temp: 98.7 F (37.1 C)  SpO2: 100%     Body mass index is 20.85 kg/m.   Wt Readings from Last 3 Encounters:  02/23/20 125 lb 4.8 oz (56.8 kg)  12/23/19 124 lb 12.8 oz (56.6 kg)  11/22/19 128 lb 1.6 oz (58.1 kg)      ECOG FS:0 - Asymptomatic  Sclerae unicteric, EOMs intact Wearing a mask No cervical or supraclavicular adenopathy Lungs no rales or rhonchi Heart regular rate and rhythm Abd soft, nontender, positive bowel sounds MSK no focal spinal tenderness, no upper extremity lymphedema Neuro: nonfocal, well  oriented, appropriate affect Breasts: The right breast is benign.  The left breast has undergone lumpectomy and radiation.  There is no evidence of local recurrence.  Both axillae are benign.   LAB RESULTS:  CMP     Component Value Date/Time   NA 141 02/23/2020 1025   K 4.5 02/23/2020 1025   CL 109 02/23/2020 1025   CO2 26 02/23/2020 1025   GLUCOSE 85 02/23/2020 1025   BUN 15 02/23/2020 1025   CREATININE 0.80 02/23/2020 1025   CREATININE 0.69 06/03/2012 0940   CALCIUM 10.4 (H) 02/23/2020 1025   PROT 7.1 02/23/2020 1025   ALBUMIN 4.2 02/23/2020 1025   AST 18 02/23/2020 1025   ALT 18 02/23/2020 1025   ALKPHOS 64 02/23/2020 1025   BILITOT 0.5 02/23/2020 1025   GFRNONAA >60 02/23/2020 1025   GFRAA >60 02/23/2020 1025    No results found for: TOTALPROTELP, ALBUMINELP, A1GS, A2GS, BETS, BETA2SER, GAMS, MSPIKE, SPEI  Lab Results  Component Value Date   WBC 4.6 02/23/2020   NEUTROABS 3.1 02/23/2020   HGB 14.3 02/23/2020   HCT 42.5 02/23/2020   MCV 91.2 02/23/2020   PLT 142 (L) 02/23/2020    No results found for: LABCA2  No components found for: [id number]  No results for input(s): INR in the last 168 hours.  No results found for: LABCA2  No results found for: ZGY174  No results found for: BSW967  No results found for: RFF638  No results found for: CA2729  No components found for: HGQUANT  No results found for: CEA1 / No results found for: CEA1   No results found for: AFPTUMOR  No results found for: CHROMOGRNA  No results found for: KPAFRELGTCHN, LAMBDASER, KAPLAMBRATIO (kappa/lambda light chains)  No results found for: HGBA, HGBA2QUANT, HGBFQUANT, HGBSQUAN (Hemoglobinopathy evaluation)   No results found for: LDH  No results found for: IRON, TIBC, IRONPCTSAT (Iron and TIBC)  No results found for: FERRITIN  Urinalysis    Component Value Date/Time   COLORURINE DARK YELLOW 04/07/2015 0850   APPEARANCEUR TURBID (A) 04/07/2015 0850   LABSPEC 1.031  04/07/2015 0850   PHURINE 5.5 04/07/2015 0850   GLUCOSEU NEGATIVE 04/07/2015 [place] 04/07/2015 [place] 04/07/2015 [place] 04/07/2015 [place] 04/07/2015 0850   UROBILINOGEN 0.2 06/03/2012 0940  NITRITE NEGATIVE 04/07/2015 0850   LEUKOCYTESUR TRACE (A) 04/07/2015 0850    STUDIES: No results found.   ELIGIBLE FOR AVAILABLE RESEARCH PROTOCOL: No  ASSESSMENT: 44 y.[place], [place] woman status post left breast lower outer quadrant biopsy 07/22/2019 for a clinical T1c N0, stage IA invasive lobular carcinoma, E-cadherin negative, estrogen and progesterone receptor positive, HER-2 not amplified, with an MIB-1 of 2%  (1) genetics testing 08/18/2019 through the [facility] Breast Cancer STAT Panel + Common Hereditary Cancers Panels found no deleterious mutations in ATM, BRCA1, BRCA2, CDH1, CHEK2, PALB2, PTEN, STK11 and TP53, APC, ATM, AXIN2, BARD1, BMPR1A, BRCA1, BRCA2, BRIP1, CDH1, CDKN2A (p14ARF), CDKN2A (p16INK4a), CKD4, CHEK2, CTNNA1, DICER1, EPCAM (Deletion/duplication testing only), GREM1 (promoter region deletion/duplication testing only), KIT, MEN1, MLH1, MSH2, MSH3, MSH6, MUTYH, NBN, NF1, NHTL1, PALB2, PDGFRA, PMS2, POLD1, POLE, PTEN, RAD50, RAD51C, RAD51D, RNF43, SDHB, SDHC, SDHD, SMAD4, SMARCA4. STK11, TP53, TSC1, TSC2, and VHL.  The following genes were evaluated for sequence changes only: SDHA and HOXB13 c.251G>A variant only.  (2) status post left lumpectomy and sentinel lymph node sampling 08/31/2019 for a pT1c pN0, stage IA invasive ductal carcinoma, grade 2, with focally positive posterior and superior margins.  (a) a total of 8 left axillary lymph nodes were removed  (b) additional surgery 09/09/2019 showed a 0.2 cm focus of invasive lobular carcinoma 0.3 cm from the new left inferior margin  (3) Oncotype score of 13 predicts a risk of recurrence outside the breast in the next 9 years of 4% if the patient's only  systemic therapy is antiestrogens for 5 years.  It also predicts no benefit from chemotherapy.  (4) adjuvant radiation 10/14/2019 through 11/10/2019  (5) tamoxifen started 11/22/2019   PLAN: Zury is tolerating tamoxifen well and we discussed the fact that tamoxifen really does not cause weight gain.  The change from pre to post menopause transition is generally associated with weight gain between 15 and 20 pounds.  This is due to changes in metabolic rate.  If she wants to lose weight or maintain her weight then she will have to count her calories and we discussed particularly backing off on carbs.  The maximum recommended wine intake for women is 1 cup at night.  She understands there are no health benefits from wine intake and that it does increase the risk of cancer, although only mildly so.  We discussed some strategies where she can cut back without losing the benefit she is receiving psychologically from this.  I commended her excellent exercise program.  I suggested she formalized her healthcare power of attorney and discuss it with her sister whom she intends to name.  She is going to have mammography in November.  She will arrange to see her gynecologist shortly after that.  In that case she will return to see me in March.  She knows to call for any other issue that may develop before the next visit  Total encounter time 35 minutes.Chauncey Cruel, MD   02/23/2020 5:53 PM Medical Oncology and Hematology [facility] [place], [place] [address] Tel. [phone]    Fax. [phone]   This document serves as a record of services personally performed by Lurline Del, MD. It was created on his behalf by Wilburn Mylar, a trained medical scribe. The creation of this record is based on the scribe's personal observations and the provider's statements to them.   Lindie Spruce MD, have reviewed the above documentation for accuracy and  completeness, and I agree with the above.   *Total Encounter Time as defined by the Centers for Medicare and Medicaid Services includes, in addition to the face-to-face time of a patient visit (documented in the note above) non-face-to-face time: obtaining and reviewing outside history, ordering and reviewing medications, tests or procedures, care coordination (communications with other health care professionals or caregivers) and documentation in the medical record.

## 2020-02-23 ENCOUNTER — Inpatient Hospital Stay (HOSPITAL_BASED_OUTPATIENT_CLINIC_OR_DEPARTMENT_OTHER): Payer: 59 | Admitting: Oncology

## 2020-02-23 ENCOUNTER — Other Ambulatory Visit: Payer: Self-pay

## 2020-02-23 ENCOUNTER — Inpatient Hospital Stay: Payer: 59 | Attending: Oncology

## 2020-02-23 VITALS — BP 136/78 | HR 59 | Temp 98.7°F | Resp 20 | Ht 65.0 in | Wt 125.3 lb

## 2020-02-23 DIAGNOSIS — C50512 Malignant neoplasm of lower-outer quadrant of left female breast: Secondary | ICD-10-CM

## 2020-02-23 DIAGNOSIS — Z79810 Long term (current) use of selective estrogen receptor modulators (SERMs): Secondary | ICD-10-CM | POA: Insufficient documentation

## 2020-02-23 DIAGNOSIS — R232 Flushing: Secondary | ICD-10-CM | POA: Insufficient documentation

## 2020-02-23 DIAGNOSIS — Z803 Family history of malignant neoplasm of breast: Secondary | ICD-10-CM | POA: Insufficient documentation

## 2020-02-23 DIAGNOSIS — I10 Essential (primary) hypertension: Secondary | ICD-10-CM | POA: Insufficient documentation

## 2020-02-23 DIAGNOSIS — Z79899 Other long term (current) drug therapy: Secondary | ICD-10-CM | POA: Diagnosis not present

## 2020-02-23 DIAGNOSIS — Z170 Estrogen receptor positive status [ER+]: Secondary | ICD-10-CM | POA: Insufficient documentation

## 2020-02-23 LAB — CBC WITH DIFFERENTIAL/PLATELET
Abs Immature Granulocytes: 0.01 10*3/uL (ref 0.00–0.07)
Basophils Absolute: 0 10*3/uL (ref 0.0–0.1)
Basophils Relative: 1 %
Eosinophils Absolute: 0.1 10*3/uL (ref 0.0–0.5)
Eosinophils Relative: 2 %
HCT: 42.5 % (ref 36.0–46.0)
Hemoglobin: 14.3 g/dL (ref 12.0–15.0)
Immature Granulocytes: 0 %
Lymphocytes Relative: 23 %
Lymphs Abs: 1 10*3/uL (ref 0.7–4.0)
MCH: 30.7 pg (ref 26.0–34.0)
MCHC: 33.6 g/dL (ref 30.0–36.0)
MCV: 91.2 fL (ref 80.0–100.0)
Monocytes Absolute: 0.3 10*3/uL (ref 0.1–1.0)
Monocytes Relative: 6 %
Neutro Abs: 3.1 10*3/uL (ref 1.7–7.7)
Neutrophils Relative %: 68 %
Platelets: 142 10*3/uL — ABNORMAL LOW (ref 150–400)
RBC: 4.66 MIL/uL (ref 3.87–5.11)
RDW: 12.5 % (ref 11.5–15.5)
WBC: 4.6 10*3/uL (ref 4.0–10.5)
nRBC: 0 % (ref 0.0–0.2)

## 2020-02-23 LAB — COMPREHENSIVE METABOLIC PANEL
ALT: 18 U/L (ref 0–44)
AST: 18 U/L (ref 15–41)
Albumin: 4.2 g/dL (ref 3.5–5.0)
Alkaline Phosphatase: 64 U/L (ref 38–126)
Anion gap: 6 (ref 5–15)
BUN: 15 mg/dL (ref 6–20)
CO2: 26 mmol/L (ref 22–32)
Calcium: 10.4 mg/dL — ABNORMAL HIGH (ref 8.9–10.3)
Chloride: 109 mmol/L (ref 98–111)
Creatinine, Ser: 0.8 mg/dL (ref 0.44–1.00)
GFR calc Af Amer: >60 mL/min (ref >60)
GFR calc non Af Amer: >60 mL/min (ref >60)
Glucose, Bld: 85 mg/dL (ref 70–99)
Potassium: 4.5 mmol/L (ref 3.5–5.1)
Sodium: 141 mmol/L (ref 135–145)
Total Bilirubin: 0.5 mg/dL (ref 0.3–1.2)
Total Protein: 7.1 g/dL (ref 6.5–8.1)

## 2020-02-25 ENCOUNTER — Telehealth: Payer: Self-pay | Admitting: Oncology

## 2020-02-25 NOTE — Telephone Encounter (Signed)
Scheduled appts per 8/4 los. Pt confirmed appt date and time.

## 2020-03-01 ENCOUNTER — Other Ambulatory Visit: Payer: 59

## 2020-03-01 ENCOUNTER — Ambulatory Visit: Payer: 59 | Admitting: Oncology

## 2020-06-21 HISTORY — PX: BREAST LUMPECTOMY: SHX2

## 2020-07-07 ENCOUNTER — Other Ambulatory Visit: Payer: Self-pay

## 2020-07-07 ENCOUNTER — Ambulatory Visit
Admission: RE | Admit: 2020-07-07 | Discharge: 2020-07-07 | Disposition: A | Discharge status: Home / Self Care | Payer: 59 | Source: Ambulatory Visit | Attending: Oncology | Admitting: Oncology

## 2020-07-07 DIAGNOSIS — C50512 Malignant neoplasm of lower-outer quadrant of left female breast: Secondary | ICD-10-CM

## 2020-10-03 NOTE — Progress Notes (Signed)
[facility]  [phone] [phone]     ID: Amy Fisher DOB: 08-06-1960  MR#: [id number]  JYN#:[id number]  Patient Care Team: Joycelyn Rua, MD as PCP - General (Family Medicine) Pershing Proud, RN as Oncology Nurse Navigator Donnelly Angelica, RN as Oncology Nurse Navigator Ovidio Kin, MD as Consulting Physician (General Surgery) Arliene Rosenow, Valentino Hue, MD as Consulting Physician (Oncology) Antony Blackbird, MD as Consulting Physician (Radiation Oncology) Lowella Dell, MD OTHER MD:   CHIEF COMPLAINT: Estrogen receptor positive lobular breast cancer  CURRENT TREATMENT: tamoxifen   INTERVAL HISTORY: Mckenna returns today for follow up of her estrogen receptor positive lobular breast cancer.  She started tamoxifen on 11/22/2019.  She is tolerating it well, in general, although at night she sometimes wakes up feeling hot.  Since her last visit, she underwent bilateral diagnostic mammography with tomography at [facility] on 07/07/2020 showing: breast density category C; no evidence of malignancy in either breast.    REVIEW OF SYSTEMS: Amy Fisher is concerned about weight gain.  She wonders if tamoxifen could be the cause of that.  She also feels anxious.  She also wonders if tamoxifen is related to that.  She tells me she was worked up for asthma and given a variety of inhalers but it turns out she does not have asthma and she wanted to make sure I updated her medication list.  She would like to perhaps drink a little less wine--she has 2 cups daily--and we discussed some strategies last time she saw me cough which she has had a little difficulty implementing.  Aside from these issues a detailed review of systems today was stable and she has an excellent exercise program.   COVID 19 VACCINATION STATUS:    HISTORY OF CURRENT ILLNESS: From the original intake note:  Amy Fisher had routine screening mammography on 07/07/2019 showing a  possible abnormality in the left breast. She underwent left diagnostic mammography with tomography and left breast ultrasonography at [facility] on 07/14/2019 showing: breast density category C; irregular 1.5 cm mass at 4 o'clock in the left breast with a 0.4 cm adjacent satellite nodule; no left axillary adenopathy. Physical exam showed a 5 mm blue domed cyst in the left breast a 4 o'clock, palpable as a BB size superficial mass and a discrete palpable 2 cm mass approximately 2 cm below the previous.  Accordingly on 07/22/2019 she proceeded to biopsy of the left breast area in question. The pathology from this procedure ([id number]) showed: invasive mammary carcinoma, grade 2, e-cadherin negative. Prognostic indicators significant for: estrogen receptor, 100% positive and progesterone receptor, 95% positive, both with strong staining intensity. Proliferation marker Ki67 at 2%. HER2 equivocal by immunohistochemistry (2+), but negative by fluorescent in situ hybridization with a signals ratio 1.05 and number per cell 2.00.  The patient's subsequent history is as detailed below.   PAST MEDICAL HISTORY: Past Medical History:  Diagnosis Date  . Asthma   . Cancer (HCC)    Breast  . Cervical dysplasia    lgsil 2008, ascus 2009, normal Pap smears afterwards  . Fibroid   . Hypertension   Exercise-induced asthma, likely bilateral early carpal tunnel   PAST SURGICAL HISTORY: Past Surgical History:  Procedure Laterality Date  . BREAST LUMPECTOMY WITH RADIOACTIVE SEED AND SENTINEL LYMPH NODE BIOPSY Left 08/31/2019   Procedure: LEFT BREAST LUMPECTOMY WITH RADIOACTIVE SEED AND LEFT AXILLARY SENTINEL LYMPH NODE BIOPSY;  Surgeon: Harriette Bouillon, MD;  Location: [facility];  Service: General;  Laterality: Left;  . BREAST SURGERY     Breast Biopsy  . IUD REMOVAL  07/2017   Mirena  . PILONIDAL CYST EXCISION    . RE-EXCISION OF BREAST LUMPECTOMY Left 09/09/2019   Procedure: LEFT BREAST  RE-EXCISION OF LUMPECTOMY AND EVACUATION OF LEFT AXILLARY SEROMA;  Surgeon: Harriette Bouillon, MD;  Location: [facility];  Service: General;  Laterality: Left;    FAMILY HISTORY: Family History  Problem Relation Age of Onset  . Cancer Maternal Grandmother        ? type  . Breast cancer Mother 59  . Celiac disease Sister   . Alzheimer's disease Father   . Cancer Paternal Aunt        ? type  . Alzheimer's disease Paternal Grandfather   . Breast cancer Other        dx70s  Patient has 1 sister, 63, and 1 brother, 51, with no history of cancer. No cancers in her 3 nephews.  [name] mother was diagnosed with breast cancer at 70 and is living at 58. Patient has 1 maternal uncle, no cancers. No known cancers for her 2 female cousins. Patient's maternal grandmother did have cancer but is unsure the type. Maternal grandmother's sister did have breast cancer in her 76s. Maternal grandfather died in his 14s due to heart issues.  [name] father died at 56 due to Alzheimer's. Patient had 1 paternal aunt, she had cancer but unsure type. No cancers for her 3 paternal female cousins. Paternal grandparents both died in their 21s.  Ms. Rosenblum is unaware of previous family history of genetic testing for hereditary cancer risks. Patient's maternal ancestors are of Caucasian descent, and paternal ancestors are of Caucasian descent. There is no reported Ashkenazi Jewish ancestry. There is no known consanguinity.   GYNECOLOGIC HISTORY:  Patient's last menstrual period was 03/31/2015. Menarche: 60 years old GX P 1 LMP 2019 Contraceptive: history of Mirena IUD (removed 07/2017) HRT no Hysterectomy? no BSO? 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 She is divorced and lives by herself.  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  The patient has 1 grandchild.  She attends a local [employer]     ADVANCED DIRECTIVES: Not in place; she tells me she plans to name her Sister Yvone Neu as healthcare power of attorney   HEALTH MAINTENANCE: Social History   Tobacco Use  . Smoking status: Never Smoker  . Smokeless tobacco: Never Used  Vaping Use  . Vaping Use: Never used  Substance Use Topics  . Alcohol use: Yes    Alcohol/week: 0.0 standard drinks    Comment: OCC, WINE  . Drug use: No     Colonoscopy: 2016  PAP: 11/2019, negative  Bone density:    No Known Allergies  Current Outpatient Medications  Medication Sig Dispense Refill  . albuterol (VENTOLIN HFA) 108 (90 Base) MCG/ACT inhaler Inhale 2 puffs into the lungs every 6 (six) hours as needed for wheezing or shortness of breath.     Marland Kitchen ascorbic acid (VITAMIN C) 500 MG tablet Take 500 mg by mouth daily.    . benazepril (LOTENSIN) 10 MG tablet Take 10 mg by mouth daily.    Marland Kitchen  cholecalciferol (VITAMIN D3) 25 MCG (1000 UNIT) tablet Take 1,000 Units by mouth daily.    Marland Kitchen ibuprofen (ADVIL) 200 MG tablet Take by mouth.    . Melatonin 10 MG TABS Take 10-20 mg by mouth at bedtime as needed (sleep).    . Multiple Vitamin (MULTIVITAMIN) tablet Take 1 tablet by mouth daily.    . tamoxifen (NOLVADEX) 10 MG tablet Take 10 mg by mouth 2 (two) times daily.    . vitamin B-12 (CYANOCOBALAMIN) 1000 MCG tablet Take 1,000 mcg by mouth daily.    Monte Fantasia INHUB 250-50 MCG/DOSE AEPB Inhale 1 puff into the lungs 2 (two) times daily.     No current facility-administered medications for this visit.    OBJECTIVE: White woman who appears younger than stated age  Vitals:   10/04/20 1336  BP: (!) 133/58  Pulse: 64  Resp: 18  Temp: (!) 97.4 F (36.3 C)  SpO2: 100%     Body mass index is 21.31 kg/m.   Wt Readings from Last 3 Encounters:  10/04/20 128 lb 1 oz (58.1 kg)  02/23/20  125 lb 4.8 oz (56.8 kg)  12/23/19 124 lb 12.8 oz (56.6 kg)      ECOG FS:1 - Symptomatic but completely ambulatory  Sclerae unicteric, EOMs intact Wearing a mask No cervical or supraclavicular adenopathy Lungs no rales or rhonchi Heart regular rate and rhythm Abd soft, nontender, positive bowel sounds MSK no focal spinal tenderness, no upper extremity lymphedema Neuro: nonfocal, well oriented, appropriate affect Breasts: The right breast is unremarkable.  The left breast is status post lumpectomy and radiation.  There is no evidence of chest wall recurrence.  Both axillae are benign.   LAB RESULTS:  CMP     Component Value Date/Time   NA 141 02/23/2020 1025   K 4.5 02/23/2020 1025   CL 109 02/23/2020 1025   CO2 26 02/23/2020 1025   GLUCOSE 85 02/23/2020 1025   BUN 15 02/23/2020 1025   CREATININE 0.80 02/23/2020 1025   CREATININE 0.69 06/03/2012 0940   CALCIUM 10.4 (H) 02/23/2020 1025   PROT 7.1 02/23/2020 1025   ALBUMIN 4.2 02/23/2020 1025   AST 18 02/23/2020 1025   ALT 18 02/23/2020 1025   ALKPHOS 64 02/23/2020 1025   BILITOT 0.5 02/23/2020 1025   GFRNONAA >60 02/23/2020 1025   GFRAA >60 02/23/2020 1025    No results found for: TOTALPROTELP, ALBUMINELP, A1GS, A2GS, BETS, BETA2SER, GAMS, MSPIKE, SPEI  Lab Results  Component Value Date   WBC 5.9 10/04/2020   NEUTROABS 3.6 10/04/2020   HGB 14.2 10/04/2020   HCT 42.5 10/04/2020   MCV 90.6 10/04/2020   PLT 164 10/04/2020    No results found for: LABCA2  No components found for: [id number]  No results for input(s): INR in the last 168 hours.  No results found for: LABCA2  No results found for: TDD220  No results found for: URK270  No results found for: WCB762  No results found for: CA2729  No components found for: HGQUANT  No results found for: CEA1 / No results found for: CEA1   No results found for: AFPTUMOR  No results found for: CHROMOGRNA  No results found for: KPAFRELGTCHN, LAMBDASER,  KAPLAMBRATIO (kappa/lambda light chains)  No results found for: HGBA, HGBA2QUANT, HGBFQUANT, HGBSQUAN (Hemoglobinopathy evaluation)   No results found for: LDH  No results found for: IRON, TIBC, IRONPCTSAT (Iron and TIBC)  No results found for: FERRITIN  Urinalysis    Component Value Date/Time  COLORURINE DARK YELLOW 04/07/2015 0850   APPEARANCEUR TURBID (A) 04/07/2015 0850   LABSPEC 1.031 04/07/2015 0850   PHURINE 5.5 04/07/2015 0850   GLUCOSEU NEGATIVE 04/07/2015 0850   HGBUR NEGATIVE 04/07/2015 0850   BILIRUBINUR NEGATIVE 04/07/2015 0850   KETONESUR NEGATIVE 04/07/2015 0850   PROTEINUR NEGATIVE 04/07/2015 0850   UROBILINOGEN 0.2 06/03/2012 0940   NITRITE NEGATIVE 04/07/2015 0850   LEUKOCYTESUR TRACE (A) 04/07/2015 0850    STUDIES: No results found.   ELIGIBLE FOR AVAILABLE RESEARCH PROTOCOL: No  ASSESSMENT: 22 y.Aliene Altes, [place] woman status post left breast lower outer quadrant biopsy 07/22/2019 for a clinical T1c N0, stage IA invasive lobular carcinoma, E-cadherin negative, estrogen and progesterone receptor positive, HER-2 not amplified, with an MIB-1 of 2%  (1) genetics testing 08/18/2019 through the [facility] Breast Cancer STAT Panel + Common Hereditary Cancers Panels found no deleterious mutations in ATM, BRCA1, BRCA2, CDH1, CHEK2, PALB2, PTEN, STK11 and TP53, APC, ATM, AXIN2, BARD1, BMPR1A, BRCA1, BRCA2, BRIP1, CDH1, CDKN2A (p14ARF), CDKN2A (p16INK4a), CKD4, CHEK2, CTNNA1, DICER1, EPCAM (Deletion/duplication testing only), GREM1 (promoter region deletion/duplication testing only), KIT, MEN1, MLH1, MSH2, MSH3, MSH6, MUTYH, NBN, NF1, NHTL1, PALB2, PDGFRA, PMS2, POLD1, POLE, PTEN, RAD50, RAD51C, RAD51D, RNF43, SDHB, SDHC, SDHD, SMAD4, SMARCA4. STK11, TP53, TSC1, TSC2, and VHL.  The following genes were evaluated for sequence changes only: SDHA and HOXB13 c.251G>A variant only.  (2) status post left lumpectomy and sentinel lymph node sampling 08/31/2019 for a pT1c pN0,  stage IA invasive ductal carcinoma, grade 2, with focally positive posterior and superior margins.  (a) a total of 8 left axillary lymph nodes were removed  (b) additional surgery 09/09/2019 showed a 0.2 cm focus of invasive lobular carcinoma 0.3 cm from the new left inferior margin  (3) Oncotype score of 13 predicts a risk of recurrence outside the breast in the next 9 years of 4% if the patient's only systemic therapy is antiestrogens for 5 years.  It also predicts no benefit from chemotherapy.  (4) adjuvant radiation 10/14/2019 through 11/10/2019  (5) tamoxifen started 11/22/2019   PLAN: Drystal is now just over a year out from definitive surgery for her breast cancer with no evidence of disease recurrence.  This is very favorable.  She is generally tolerating tamoxifen well.  We reviewed the randomized data that shows tamoxifen does not cause weight gain or moodiness.  These are part of the menopausal complex of symptoms.  I do think Vanesha would benefit from gabapentin at bedtime.  We discussed that at length.  She might also benefit, particularly when she discusses irritability issues, from venlafaxine and we will try very low-dose at 37.5 daily.  Put in both those prescriptions to her as well as refilled her tamoxifen.  She will let us know after 2 or 3 weeks if she feels the venlafaxine is helping and whether she thinks we need to increase the dose to 75 mg.  We discussed strategies to discontinue drinking.  She is aware of the fact that alcohol does increase the risk of breast cancer and is also associated with other problems including developing atrial fibrillation, with no health benefits.  I think she will benefit from drinking a bit less and she is going to try to cut it down to 1 glass of wine a day initially and then perhaps more as necessary  From this point I am comfortable with her seeing [place] on a once a year basis.  She knows to call for any other issue that may develop  before  then.  Total encounter time 25 minutes.Lowella Dell, MD   10/04/2020 2:01 PM Medical Oncology and Hematology [facility] [address] [place], [place] [address] Tel. [phone]    Fax. [phone]   This document serves as a record of services personally performed by Ruthann Cancer, MD. It was created on his behalf by Mickie Bail, a trained medical scribe. The creation of this record is based on the scribe's personal observations and the provider's statements to them.   I, Ruthann Cancer MD, have reviewed the above documentation for accuracy and completeness, and I agree with the above.   *Total Encounter Time as defined by the Centers for Medicare and Medicaid Services includes, in addition to the face-to-face time of a patient visit (documented in the note above) non-face-to-face time: obtaining and reviewing outside history, ordering and reviewing medications, tests or procedures, care coordination (communications with other health care professionals or caregivers) and documentation in the medical record.

## 2020-10-04 ENCOUNTER — Other Ambulatory Visit: Payer: Self-pay

## 2020-10-04 ENCOUNTER — Inpatient Hospital Stay: Payer: 59 | Attending: Oncology | Admitting: Oncology

## 2020-10-04 ENCOUNTER — Inpatient Hospital Stay: Payer: 59

## 2020-10-04 VITALS — BP 133/58 | HR 64 | Temp 97.4°F | Resp 18 | Ht 65.0 in | Wt 128.1 lb

## 2020-10-04 DIAGNOSIS — Z170 Estrogen receptor positive status [ER+]: Secondary | ICD-10-CM | POA: Diagnosis not present

## 2020-10-04 DIAGNOSIS — C50512 Malignant neoplasm of lower-outer quadrant of left female breast: Secondary | ICD-10-CM | POA: Diagnosis not present

## 2020-10-04 LAB — COMPREHENSIVE METABOLIC PANEL
ALT: 28 U/L (ref 0–44)
AST: 26 U/L (ref 15–41)
Albumin: 4.5 g/dL (ref 3.5–5.0)
Alkaline Phosphatase: 54 U/L (ref 38–126)
Anion gap: 6 (ref 5–15)
BUN: 13 mg/dL (ref 6–20)
CO2: 28 mmol/L (ref 22–32)
Calcium: 10 mg/dL (ref 8.9–10.3)
Chloride: 105 mmol/L (ref 98–111)
Creatinine, Ser: 0.77 mg/dL (ref 0.44–1.00)
GFR, Estimated: >60 mL/min (ref >60)
Glucose, Bld: 87 mg/dL (ref 70–99)
Potassium: 4.1 mmol/L (ref 3.5–5.1)
Sodium: 139 mmol/L (ref 135–145)
Total Bilirubin: 0.6 mg/dL (ref 0.3–1.2)
Total Protein: 7.4 g/dL (ref 6.5–8.1)

## 2020-10-04 LAB — CBC WITH DIFFERENTIAL/PLATELET
Abs Immature Granulocytes: 0.02 10*3/uL (ref 0.00–0.07)
Basophils Absolute: 0.1 10*3/uL (ref 0.0–0.1)
Basophils Relative: 1 %
Eosinophils Absolute: 0.1 10*3/uL (ref 0.0–0.5)
Eosinophils Relative: 2 %
HCT: 42.5 % (ref 36.0–46.0)
Hemoglobin: 14.2 g/dL (ref 12.0–15.0)
Immature Granulocytes: 0 %
Lymphocytes Relative: 31 %
Lymphs Abs: 1.8 10*3/uL (ref 0.7–4.0)
MCH: 30.3 pg (ref 26.0–34.0)
MCHC: 33.4 g/dL (ref 30.0–36.0)
MCV: 90.6 fL (ref 80.0–100.0)
Monocytes Absolute: 0.3 10*3/uL (ref 0.1–1.0)
Monocytes Relative: 5 %
Neutro Abs: 3.6 10*3/uL (ref 1.7–7.7)
Neutrophils Relative %: 61 %
Platelets: 164 10*3/uL (ref 150–400)
RBC: 4.69 MIL/uL (ref 3.87–5.11)
RDW: 12.4 % (ref 11.5–15.5)
WBC: 5.9 10*3/uL (ref 4.0–10.5)
nRBC: 0 % (ref 0.0–0.2)

## 2020-10-04 MED ORDER — VENLAFAXINE HCL ER 37.5 MG PO CP24: 37.5000 mg | ORAL_CAPSULE | Freq: Every day | ORAL | 4 refills | Status: DC

## 2020-10-04 MED ORDER — GABAPENTIN 300 MG PO CAPS: 300.0000 mg | ORAL_CAPSULE | Freq: Every day | ORAL | 4 refills | Status: DC

## 2020-10-04 MED ORDER — TAMOXIFEN CITRATE 10 MG PO TABS: 10.0000 mg | ORAL_TABLET | Freq: Two times a day (BID) | ORAL | 4 refills | Status: DC

## 2020-10-29 DIAGNOSIS — I10 Essential (primary) hypertension: Secondary | ICD-10-CM | POA: Insufficient documentation

## 2020-11-11 IMAGING — MG MM DIGITAL DIAGNOSTIC UNILAT*L* W/ TOMO W/ CAD
6 series · 6 of 18 positions shown · non-contrast
Comparison: 07/07/2019 and earlier

CLINICAL DATA: The patient returns after screening study for
evaluation of possible LEFT breast mass associated with distortion.

EXAM:
DIGITAL DIAGNOSTIC LEFT MAMMOGRAM WITH CAD AND TOMO
ULTRASOUND LEFT BREAST

[L CC synth-2D]
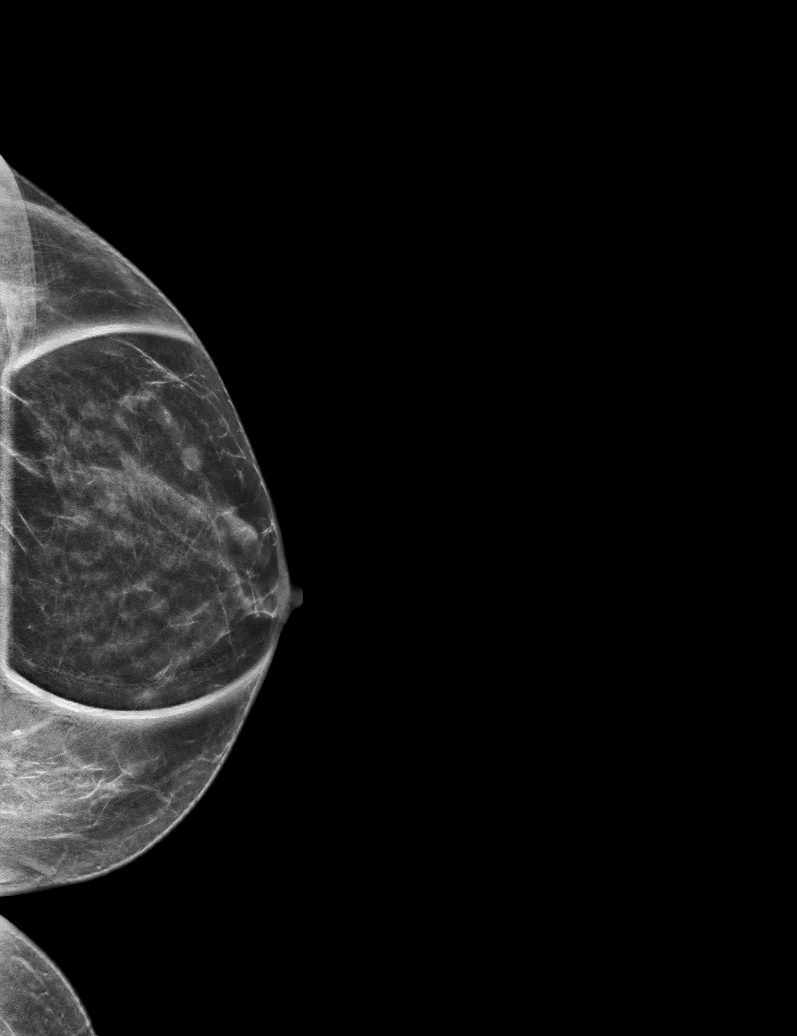

[L MLO synth-2D]
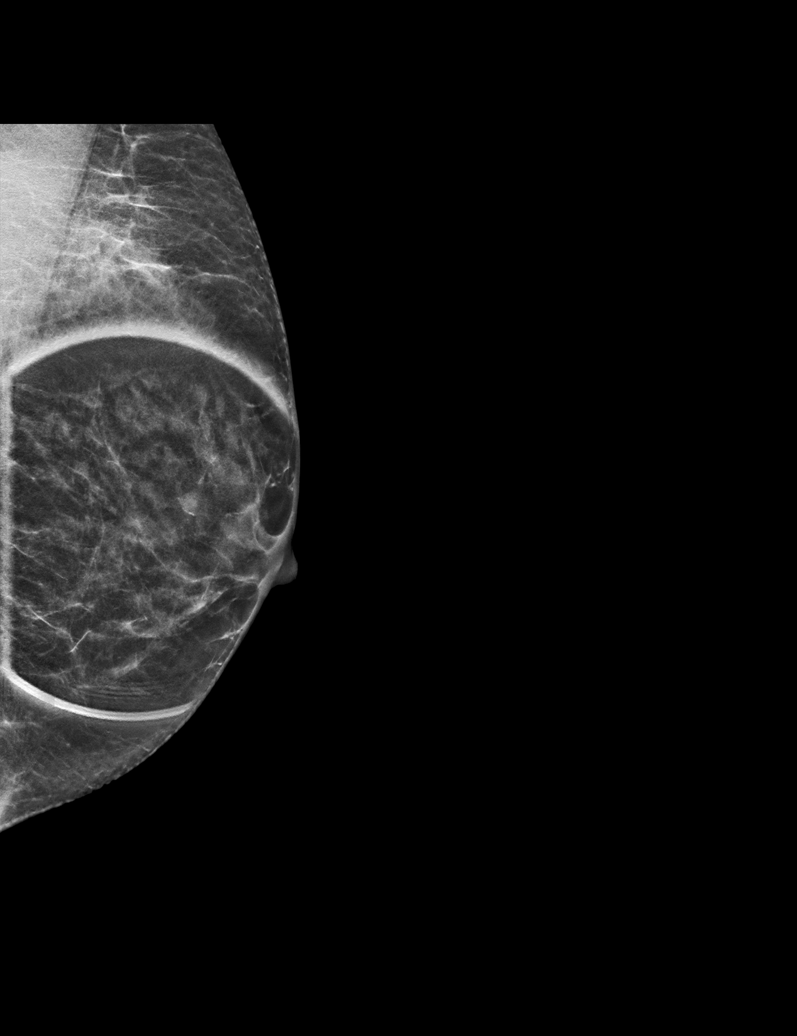

[L ML synth-2D]
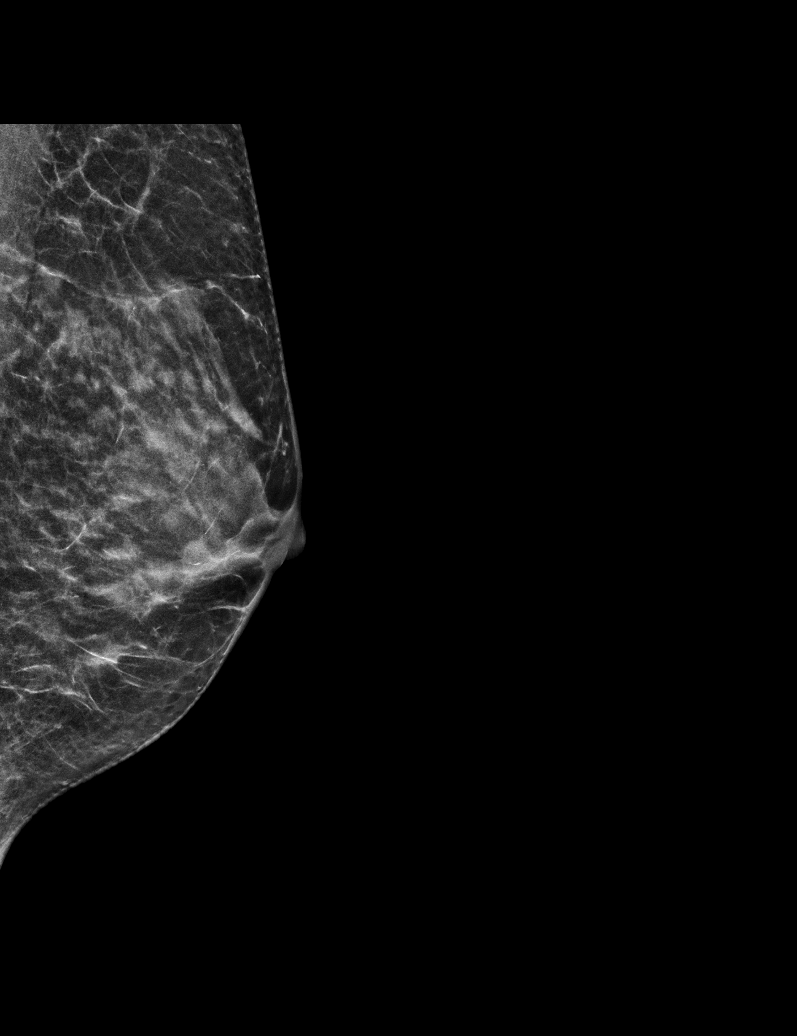

[L ML tomo · tomo slice 23/45.0]
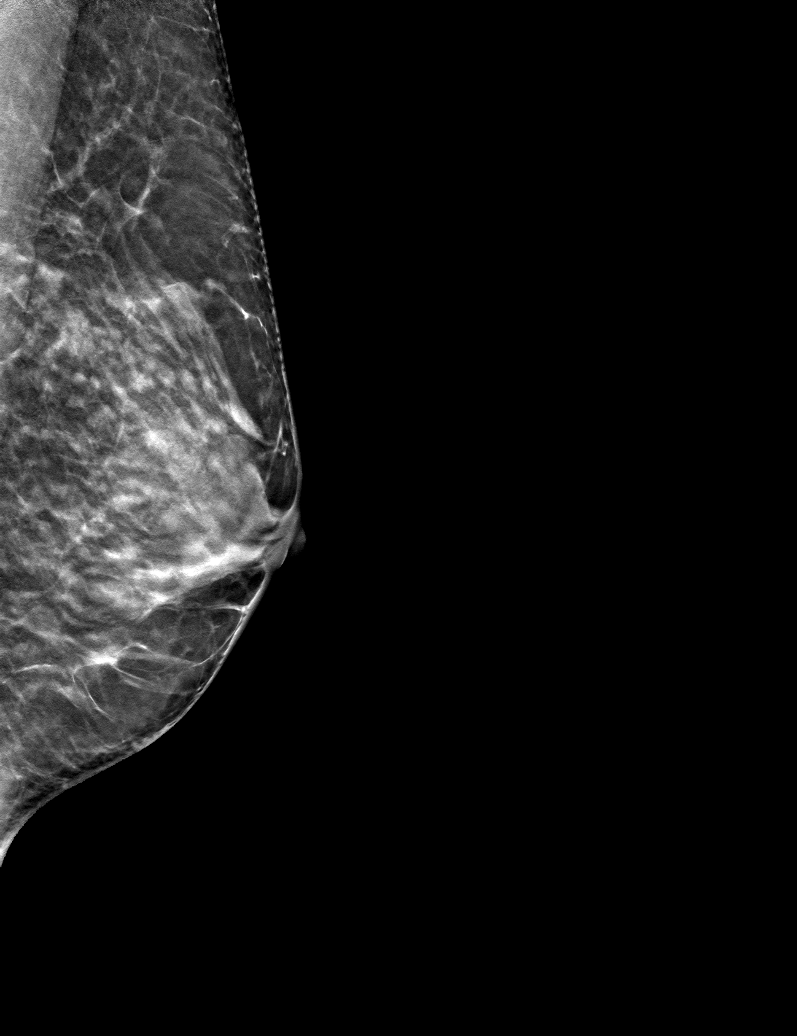

[L CC tomo · tomo slice 27/52.0]
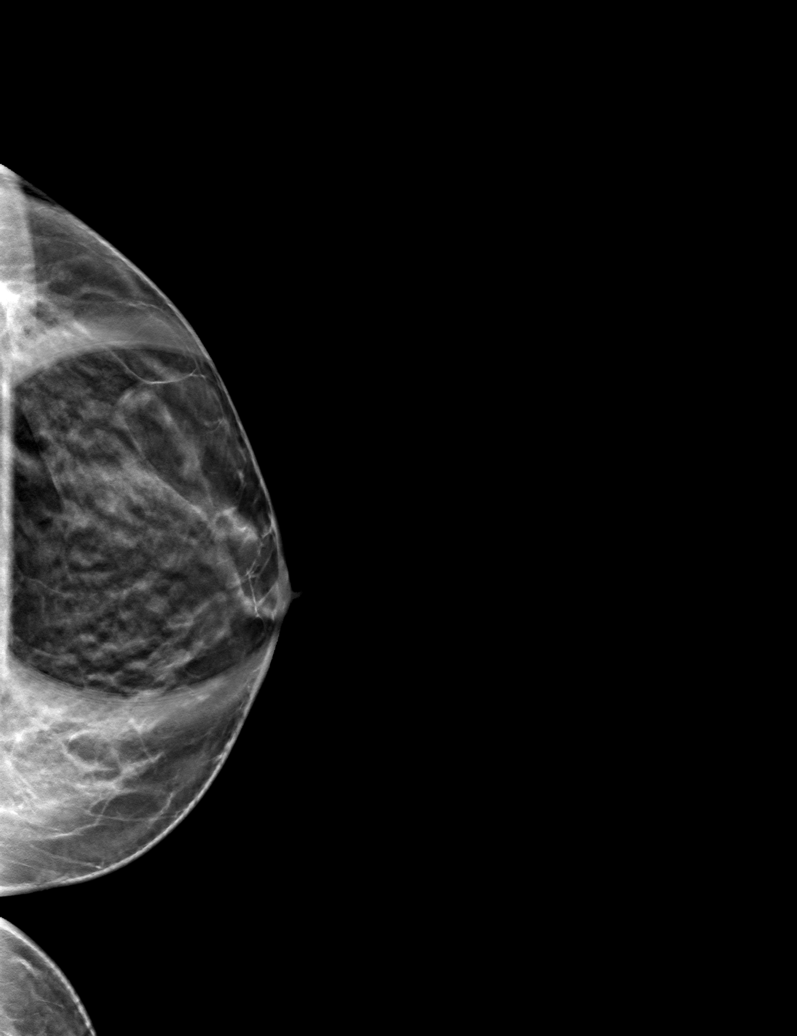

[L MLO tomo · tomo slice 25/48.0]
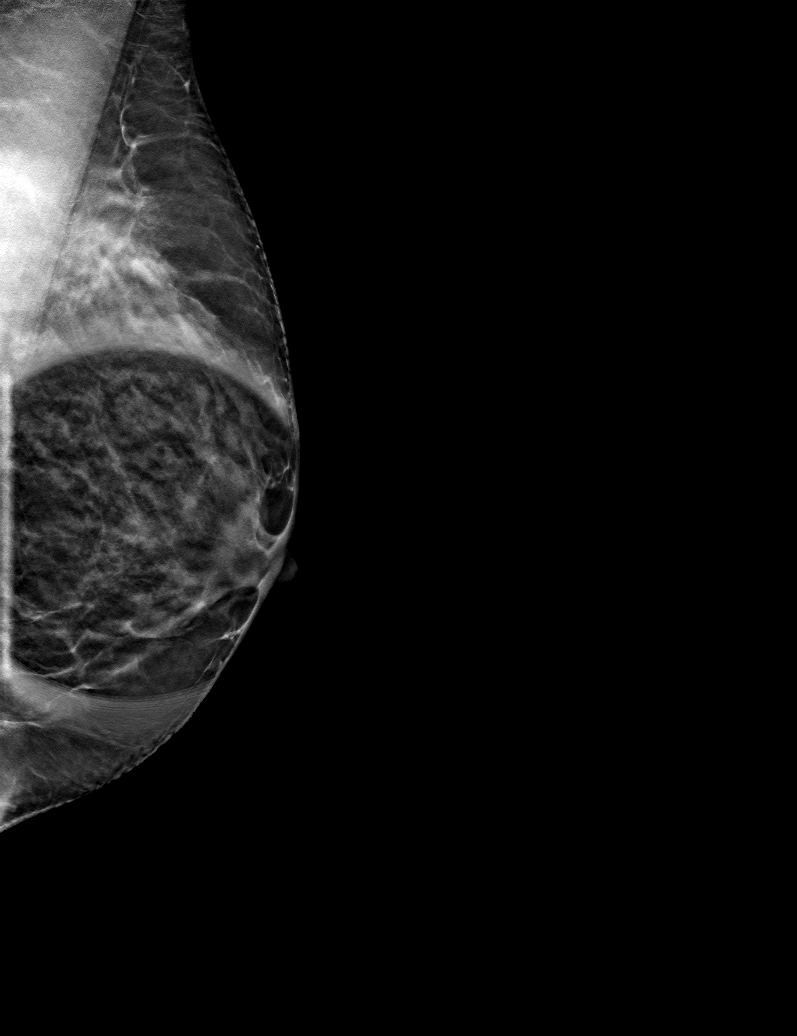

[6 of 18 positions shown; findings below may reference images not displayed]

ACR Breast Density Category c: The breast tissue is heterogeneously
dense, which may obscure small masses.
FINDINGS: Additional 2-D and 3-D images are performed. These views confirm
presence of distortion in the LOWER OUTER QUADRANT of the LEFT
breast and further evaluated with ultrasound. Additionally, there is
a superficial smaller oval mass measuring 4 millimeters. This mass
has decreased in size since evaluated in 1801. Ultrasound and
diagnostic mammogram performed at that time showed a superficial
cyst measuring 0.8 x 0.7 x 0.6 centimeters. This cyst was visible as
a bluish superficial mass.

Mammographic images were processed with CAD.

On physical exam, there is a 5 millimeter blue domed cyst in the 4
o'clock location of the LEFT breast, palpable as a BB size
superficial mass. Approximately 2 centimeters below this lesion
there is a discrete palpable 2 centimeter mass.

Targeted ultrasound is performed, showing an irregular mass with
irregular margins in the 4 o'clock location of the LEFT breast 2
centimeters from the nipple. Mass measures 1.5 x 1.1 x
centimeters. An adjacent small satellite nodule is 0.4 x 0.3 x
centimeters. The smaller mass is 3 millimeters from the larger mass.
Measured together, these are 2.0 centimeters in diameter. Doppler
evaluation demonstrates significant internal blood flow.

Evaluation of the LEFT axilla is negative for adenopathy.
IMPRESSION: 1. Irregular mass in the 4 o'clock location of the LEFT breast for
which biopsy is recommended. This lesion has an adjacent 4
millimeter satellite nodule, likely with similar pathology. I would
recommend biopsy of these adjacent lesions as a single lesion.
2. No LEFT axillary adenopathy.

RECOMMENDATION:
Ultrasound-guided core biopsy of LEFT breast mass 4 o'clock
location.

I have discussed the findings and recommendations with the patient.
If applicable, a reminder letter will be sent to the patient
regarding the next appointment.

BI-RADS CATEGORY  4: Suspicious.

## 2020-12-06 ENCOUNTER — Encounter: Payer: Self-pay | Admitting: Oncology

## 2020-12-07 ENCOUNTER — Other Ambulatory Visit: Payer: Self-pay | Admitting: *Deleted

## 2020-12-07 MED ORDER — TAMOXIFEN CITRATE 20 MG PO TABS: 20.0000 mg | ORAL_TABLET | Freq: Every day | ORAL | 3 refills | Status: DC

## 2020-12-07 NOTE — Telephone Encounter (Signed)
Per my chart message this RN called pt to inquire further about requested change in tamoxifen dose refill.  Pt states she has a bottle that is 5 mg tablets with instructions to take 1 twice a day. She would prefer to take 1 tablet of 10 mg instead.  This RN reviewed chart and noted refills showing pt has been prescribed 10 mg tablet with dosing of 1 tablet bid for total daily dose of 20 mg.  This RN also informed her per contact with [facility] mail order pharmacist Amy Fisher ) that last dispense was for #180 of 10 mg tablets with instructions for 1 tablet bid.  Amy Fisher states she is sure that her bottle is for 5 mg tablets.  This RN informed pt of need to verify dose so this office can refill appropriately.  This RN asked pt to review bottle when she returns home and she can take a picture of it and attach it to her my chart message.  Amy Fisher verbalized understanding.

## 2020-12-29 IMAGING — DX MM BREAST SURGICAL SPECIMEN
1 series · 2 of 2 positions shown · non-contrast
Comparison: Previous exam(s).

CLINICAL DATA: Status post radioactive seed localized lumpectomy of
the LEFT breast.

EXAM:
SPECIMEN RADIOGRAPH OF THE LEFT BREAST

[Series 2: specimen digital x-ray, derived · left · 2 of 2 slices shown]
[im 1/2]
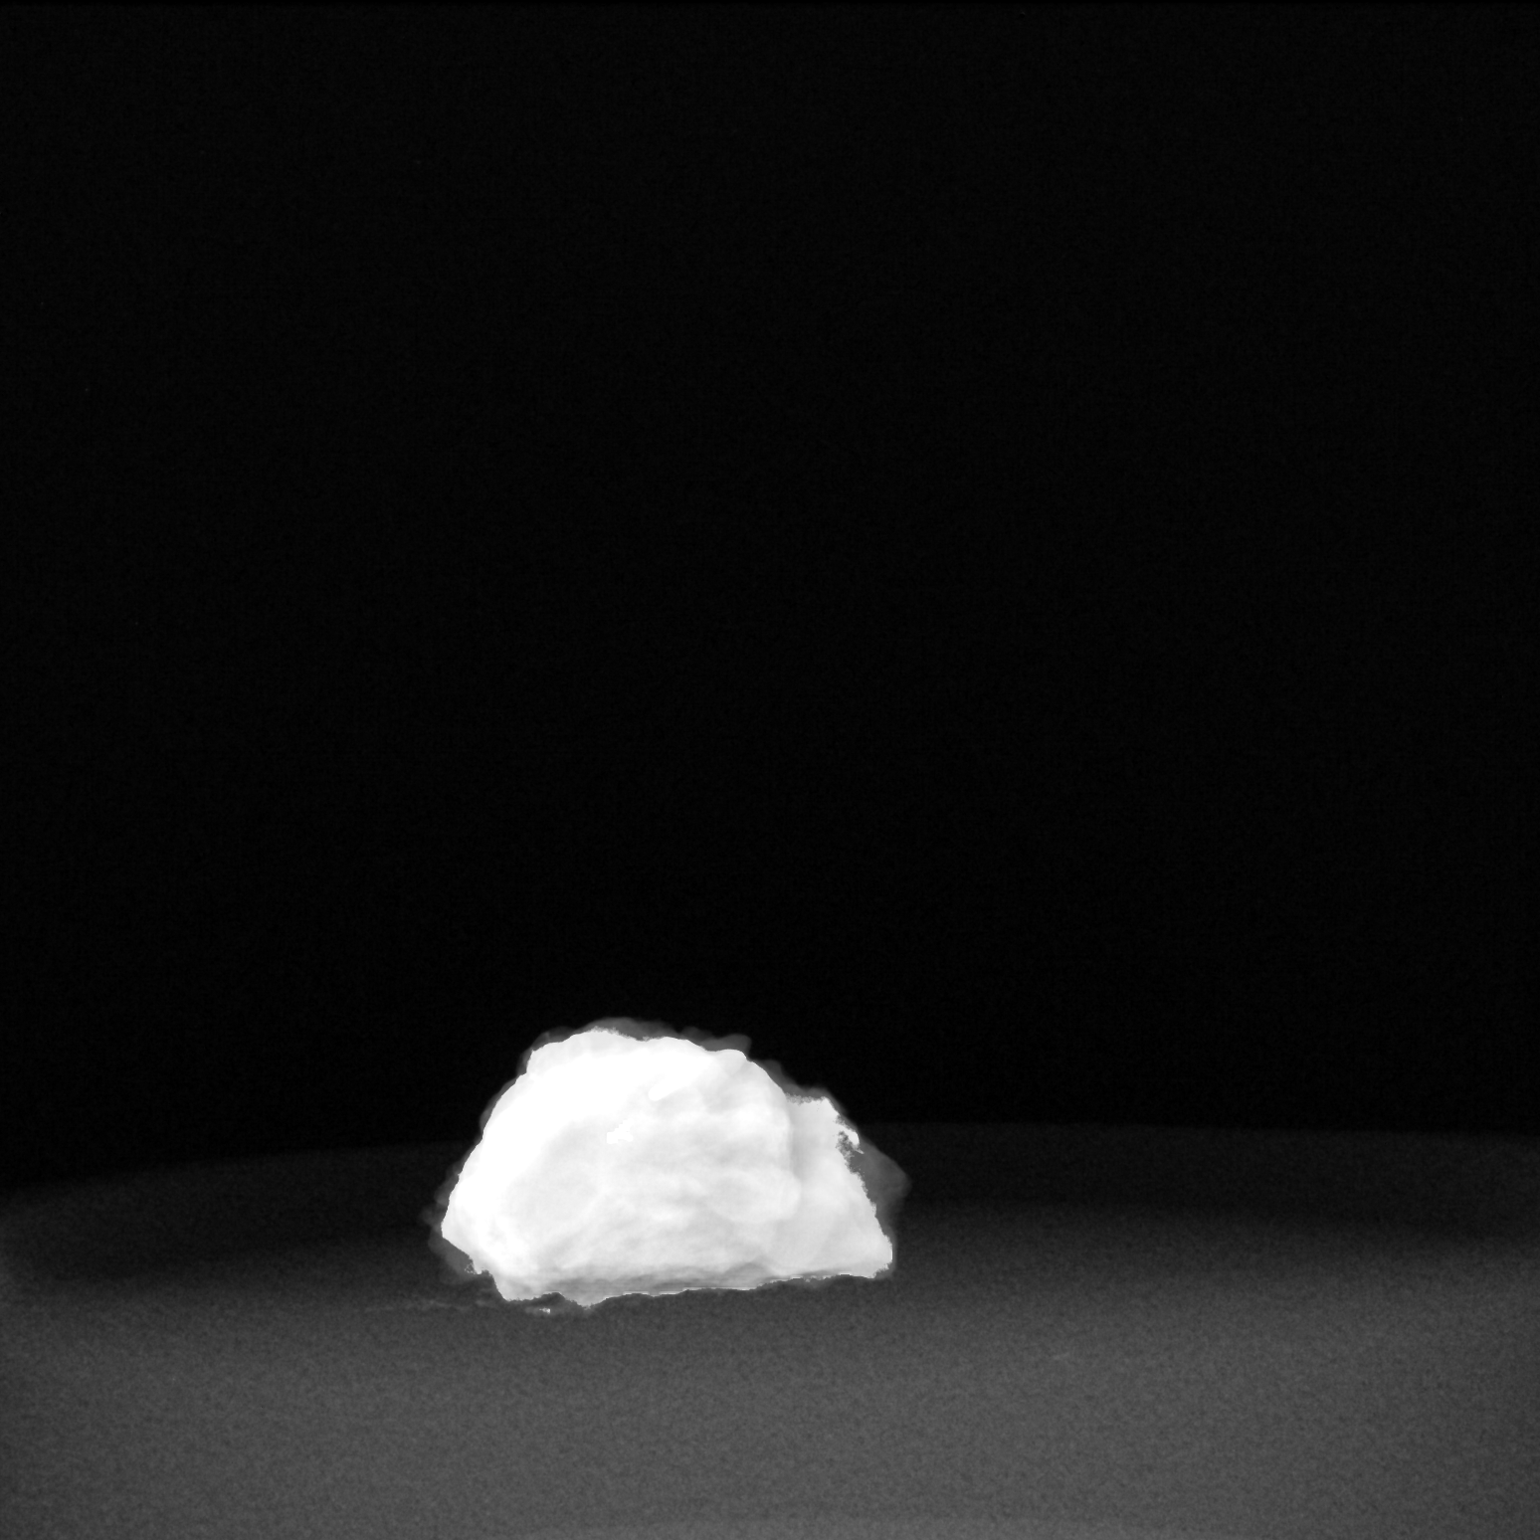
[im 2/2]
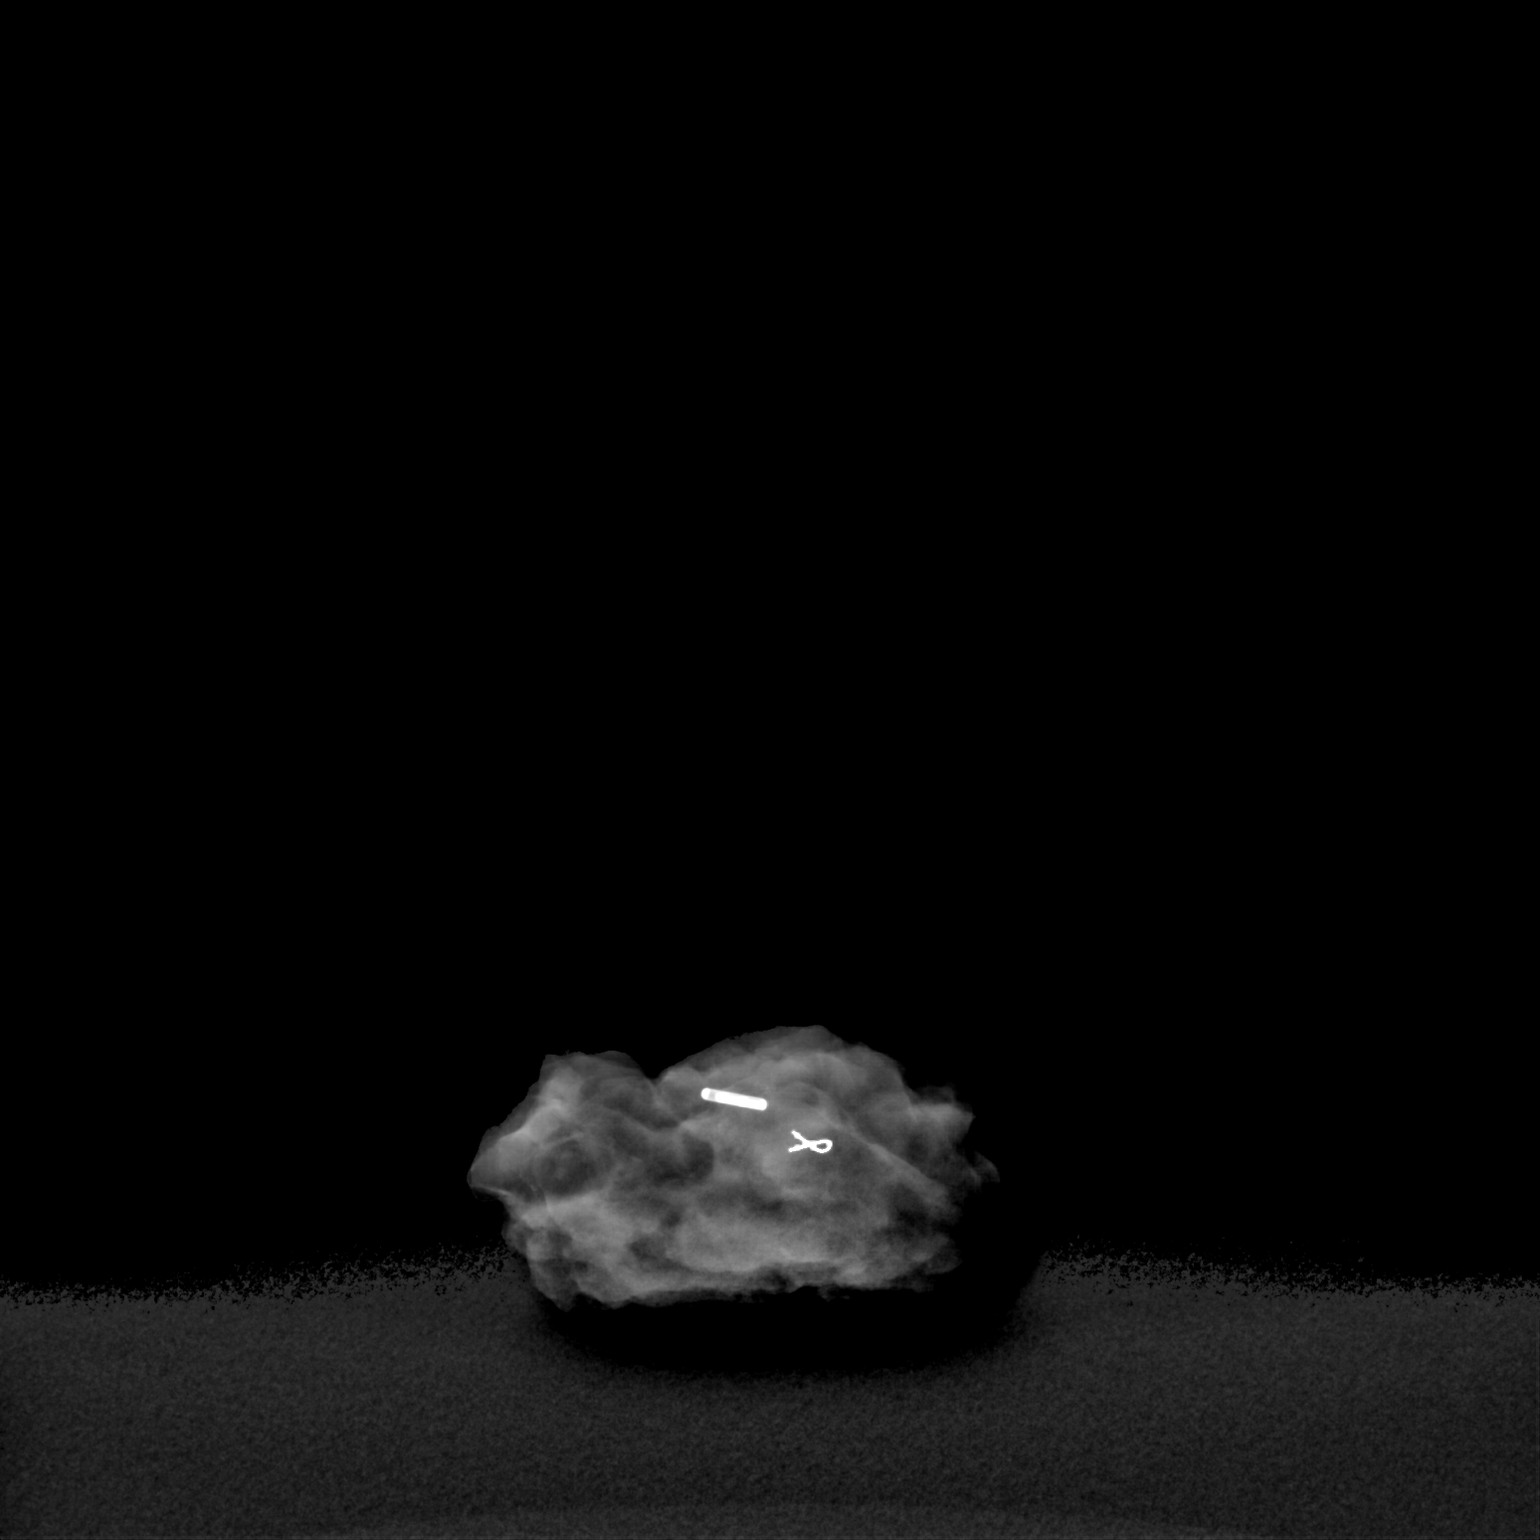

[2 of 2 positions shown; findings below may reference images not displayed]

FINDINGS: Status post excision of the left breast. The radioactive seed and
ribbon shaped biopsy marker clip are present, completely intact, and
were marked for pathology.
IMPRESSION: Specimen radiograph of the LEFT breast.

## 2021-01-15 DIAGNOSIS — J386 Stenosis of larynx: Secondary | ICD-10-CM | POA: Insufficient documentation

## 2021-05-17 ENCOUNTER — Other Ambulatory Visit: Payer: Self-pay | Admitting: Oncology

## 2021-05-17 DIAGNOSIS — R928 Other abnormal and inconclusive findings on diagnostic imaging of breast: Secondary | ICD-10-CM

## 2021-06-05 ENCOUNTER — Encounter: Payer: Self-pay | Admitting: Oncology

## 2021-07-04 ENCOUNTER — Other Ambulatory Visit: Payer: Self-pay | Admitting: Oncology

## 2021-07-04 DIAGNOSIS — Z853 Personal history of malignant neoplasm of breast: Secondary | ICD-10-CM

## 2021-07-09 ENCOUNTER — Ambulatory Visit
Admission: RE | Admit: 2021-07-09 | Discharge: 2021-07-09 | Disposition: A | Discharge status: Home / Self Care | Payer: 59 | Source: Ambulatory Visit | Attending: Oncology | Admitting: Oncology

## 2021-07-09 DIAGNOSIS — Z853 Personal history of malignant neoplasm of breast: Secondary | ICD-10-CM

## 2021-10-01 ENCOUNTER — Telehealth: Payer: Self-pay | Admitting: Oncology

## 2021-10-01 NOTE — Telephone Encounter (Signed)
Rescheduled appointment time per provider template. Patient is aware of updated times. ?

## 2021-10-03 ENCOUNTER — Other Ambulatory Visit: Payer: Self-pay

## 2021-10-03 DIAGNOSIS — Z170 Estrogen receptor positive status [ER+]: Secondary | ICD-10-CM

## 2021-10-04 ENCOUNTER — Inpatient Hospital Stay: Payer: 59 | Admitting: Adult Health

## 2021-10-04 ENCOUNTER — Encounter: Payer: Self-pay | Admitting: Adult Health

## 2021-10-04 ENCOUNTER — Inpatient Hospital Stay: Payer: 59 | Attending: Adult Health

## 2021-10-04 ENCOUNTER — Other Ambulatory Visit: Payer: Self-pay

## 2021-10-04 ENCOUNTER — Inpatient Hospital Stay: Payer: 59

## 2021-10-04 VITALS — BP 133/71 | HR 69 | Temp 97.7°F | Resp 18 | Ht 65.0 in | Wt 126.5 lb

## 2021-10-04 DIAGNOSIS — Z170 Estrogen receptor positive status [ER+]: Secondary | ICD-10-CM | POA: Diagnosis not present

## 2021-10-04 DIAGNOSIS — N6310 Unspecified lump in the right breast, unspecified quadrant: Secondary | ICD-10-CM | POA: Diagnosis not present

## 2021-10-04 DIAGNOSIS — I10 Essential (primary) hypertension: Secondary | ICD-10-CM | POA: Diagnosis not present

## 2021-10-04 DIAGNOSIS — C50512 Malignant neoplasm of lower-outer quadrant of left female breast: Secondary | ICD-10-CM | POA: Insufficient documentation

## 2021-10-04 DIAGNOSIS — N63 Unspecified lump in unspecified breast: Secondary | ICD-10-CM

## 2021-10-04 DIAGNOSIS — Z803 Family history of malignant neoplasm of breast: Secondary | ICD-10-CM | POA: Diagnosis not present

## 2021-10-04 LAB — CMP (CANCER CENTER ONLY)
ALT: 22 U/L (ref 0–44)
AST: 21 U/L (ref 15–41)
Albumin: 4.4 g/dL (ref 3.5–5.0)
Alkaline Phosphatase: 46 U/L (ref 38–126)
Anion gap: 7 (ref 5–15)
BUN: 18 mg/dL (ref 6–20)
CO2: 29 mmol/L (ref 22–32)
Calcium: 10.4 mg/dL — ABNORMAL HIGH (ref 8.9–10.3)
Chloride: 104 mmol/L (ref 98–111)
Creatinine: 0.85 mg/dL (ref 0.44–1.00)
GFR, Estimated: >60 mL/min (ref >60)
Glucose, Bld: 123 mg/dL — ABNORMAL HIGH (ref 70–99)
Potassium: 4 mmol/L (ref 3.5–5.1)
Sodium: 140 mmol/L (ref 135–145)
Total Bilirubin: 0.4 mg/dL (ref 0.3–1.2)
Total Protein: 6.9 g/dL (ref 6.5–8.1)

## 2021-10-04 LAB — CBC WITH DIFFERENTIAL (CANCER CENTER ONLY)
Abs Immature Granulocytes: 0.02 10*3/uL (ref 0.00–0.07)
Basophils Absolute: 0.1 10*3/uL (ref 0.0–0.1)
Basophils Relative: 1 %
Eosinophils Absolute: 0.2 10*3/uL (ref 0.0–0.5)
Eosinophils Relative: 3 %
HCT: 41.6 % (ref 36.0–46.0)
Hemoglobin: 13.9 g/dL (ref 12.0–15.0)
Immature Granulocytes: 0 %
Lymphocytes Relative: 26 %
Lymphs Abs: 1.6 10*3/uL (ref 0.7–4.0)
MCH: 30 pg (ref 26.0–34.0)
MCHC: 33.4 g/dL (ref 30.0–36.0)
MCV: 89.7 fL (ref 80.0–100.0)
Monocytes Absolute: 0.3 10*3/uL (ref 0.1–1.0)
Monocytes Relative: 5 %
Neutro Abs: 3.9 10*3/uL (ref 1.7–7.7)
Neutrophils Relative %: 65 %
Platelet Count: 161 10*3/uL (ref 150–400)
RBC: 4.64 MIL/uL (ref 3.87–5.11)
RDW: 12.5 % (ref 11.5–15.5)
WBC Count: 6 10*3/uL (ref 4.0–10.5)
nRBC: 0 % (ref 0.0–0.2)

## 2021-10-04 NOTE — Assessment & Plan Note (Signed)
Zuha is a 61 year old woman with history of stage Ia invasive lobular carcinoma estrogen and progesterone positive status post left lumpectomy, adjuvant radiation, and tamoxifen beginning on Nov 22, 2019. ? ?1.  Left-sided stage Ia breast cancer: She will continue taking tamoxifen daily.  She will continue having annual mammograms, her next is due in December 2023. ? ?2.  Right sided breast nodule: I have ordered a mammogram and ultrasound to further evaluate.  I let her know that this could very well be a simple cyst however we must get to the bottom of a new breast nodule. ? ?3.  Health maintenance: I recommended she continue with healthy diet and exercise and continue to see her primary care regularly to stay up-to-date with her cancer screenings. ? ?We will see Chantee back in 1 year for labs and follow-up.  She knows to call us in the meantime if she has any concerns. ?

## 2021-10-04 NOTE — Progress Notes (Signed)
[facility]: ?  ? ?Amy Melter, MD ?[address] ?[place] [address] ? ? ?DIAGNOSIS:  Cancer Staging  ?Malignant neoplasm of lower-outer quadrant of left breast of female, estrogen receptor positive ([place]) ?Staging form: Breast, AJCC 8th Edition ?- Clinical stage from 07/22/2019: Stage IA (cT1c, cN0, cM0, G2, ER+, PR+, HER2-) - Signed by Amy Phlegm, NP on 08/04/2019 ?Stage prefix: Initial diagnosis ?Histologic grading system: 3 grade system ?- Pathologic stage from 09/09/2019: Stage IA (pT1c, pN0, cM0, G2, ER+, PR+, HER2-) - Signed by Amy Phlegm, NP on 09/15/2019 ?Stage prefix: Initial diagnosis ?Histologic grading system: 3 grade system ? ? ?SUMMARY OF ONCOLOGIC HISTORY: ?[place], [place] woman status post left breast lower outer quadrant biopsy 07/22/2019 for a clinical T1c N0, stage IA invasive lobular carcinoma, E-cadherin negative, estrogen and progesterone receptor positive, HER-2 not amplified, with an MIB-1 of 2% ?  ?(1) genetics testing 08/18/2019 through the [facility] Breast Cancer STAT Panel + Common Hereditary Cancers Panels found no deleterious mutations in ATM, BRCA1, BRCA2, CDH1, CHEK2, PALB2, PTEN, STK11 and TP53, APC, ATM, AXIN2, BARD1, BMPR1A, BRCA1, BRCA2, BRIP1, CDH1, CDKN2A (p14ARF), CDKN2A (p16INK4a), CKD4, CHEK2, CTNNA1, DICER1, EPCAM (Deletion/duplication testing only), GREM1 (promoter region deletion/duplication testing only), KIT, MEN1, MLH1, MSH2, MSH3, MSH6, MUTYH, NBN, NF1, NHTL1, PALB2, PDGFRA, PMS2, POLD1, POLE, PTEN, RAD50, RAD51C, RAD51D, RNF43, SDHB, SDHC, SDHD, SMAD4, SMARCA4. STK11, TP53, TSC1, TSC2, and VHL.  The following genes were evaluated for sequence changes only: SDHA and HOXB13 c.251G>A variant only. ?  ?(2) status post left lumpectomy and sentinel lymph node sampling 08/31/2019 for a pT1c pN0, stage IA invasive ductal carcinoma, grade 2, with focally positive posterior and superior margins. ?             (a) a total of 8 left axillary lymph nodes were removed ?            (b) additional surgery 09/09/2019 showed a 0.2 cm focus of invasive lobular carcinoma 0.3 cm from the new left inferior margin ?  ?(3) Oncotype score of 13 predicts a risk of recurrence outside the breast in the next 9 years of 4% if the patient's only systemic therapy is antiestrogens for 5 years.  It also predicts no benefit from chemotherapy. ?  ?(4) adjuvant radiation 10/14/2019 through 11/10/2019 ?  ?(5) tamoxifen started 11/22/2019 ? ?CURRENT THERAPY: Tamoxifen daily ? ?INTERVAL HISTORY: ?Amy Fisher 61 y.o. female returns for evaluation of her history of estrogen positive breast cancer.  She underwent a bilateral diagnostic mammogram on July 09, 2021 that showed no evidence of malignancy and breast density category C. ? ?Amy Fisher is doing well today.  She continues on tamoxifen daily with minimal side effects.  Her only concern centered around weight gain, however that has leveled off.  She notes that she continues to see her primary care provider regularly and she is up-to-date with her cancer screenings.  She tells me that she exercises regularly and eats a diet rich in fruits and vegetables.  She has increased her water intake.  She does note that she drinks about 5 nights a week and is working on cutting this back. ? ?Patient Active Problem List  ? Diagnosis Date Noted  ? Genetic testing 08/19/2019  ? Family history of breast cancer   ? Malignant neoplasm of lower-outer quadrant of left breast of female, estrogen receptor positive ([place]) 08/04/2019  ? ? ?has No Known Allergies. ? ?MEDICAL HISTORY: ?Past Medical  History:  ?Diagnosis Date  ? Asthma   ? Cancer [facility])   ? Breast  ? Cervical dysplasia   ? lgsil 2008, ascus 2009, normal Pap smears afterwards  ? Fibroid   ? Hypertension   ? ? ?SURGICAL HISTORY: ?Past Surgical History:  ?Procedure Laterality Date  ? BREAST LUMPECTOMY WITH RADIOACTIVE SEED AND SENTINEL LYMPH NODE BIOPSY Left  08/31/2019  ? Procedure: LEFT BREAST LUMPECTOMY WITH RADIOACTIVE SEED AND LEFT AXILLARY SENTINEL LYMPH NODE BIOPSY;  Surgeon: Amy Luna, MD;  Location: [facility];  Service: General;  Laterality: Left;  ? BREAST SURGERY    ? Breast Biopsy  ? IUD REMOVAL  07/2017  ? Mirena  ? PILONIDAL CYST EXCISION    ? RE-EXCISION OF BREAST LUMPECTOMY Left 09/09/2019  ? Procedure: LEFT BREAST RE-EXCISION OF LUMPECTOMY AND EVACUATION OF LEFT AXILLARY SEROMA;  Surgeon: Amy Luna, MD;  Location: [facility];  Service: General;  Laterality: Left;  ? ? ?SOCIAL HISTORY: ?Social History  ? ?Socioeconomic History  ? Marital status: Divorced  ?  Spouse name: Not on file  ? Number of children: Not on file  ? Years of education: Not on file  ? Highest education level: Not on file  ?Occupational History  ? Not on file  ?Tobacco Use  ? Smoking status: Never  ? Smokeless tobacco: Never  ?Vaping Use  ? Vaping Use: Never used  ?Substance and Sexual Activity  ? Alcohol use: Yes  ?  Alcohol/week: 0.0 standard drinks  ?  Comment: OCC, WINE  ? Drug use: No  ? Sexual activity: Yes  ?  Partners: Male  ?  Birth control/protection: Post-menopausal  ?  Comment: -1st intercourse 61 yo-Fewer than 5 partners  ?Other Topics Concern  ? Not on file  ?Social History Narrative  ? Not on file  ? ?Social Determinants of Health  ? ?Financial Resource Strain: Not on file  ?Food Insecurity: Not on file  ?Transportation Needs: Not on file  ?Physical Activity: Not on file  ?Stress: Not on file  ?Social Connections: Not on file  ?Intimate Partner Violence: Not on file  ? ? ?FAMILY HISTORY: ?Family History  ?Problem Relation Age of Onset  ? Cancer Maternal Grandmother   ?     ? type  ? Breast cancer Mother 46  ? Celiac disease Sister   ? Alzheimer's disease Father   ? Cancer Paternal Aunt   ?     ? type  ? Alzheimer's disease Paternal Grandfather   ? Breast cancer Other   ?     dx70s  ? ? ?Review of Systems  ?Constitutional:  Negative  for appetite change, chills, fatigue, fever and unexpected weight change.  ?HENT:   Negative for hearing loss, lump/mass and trouble swallowing.   ?Eyes:  Negative for eye problems and icterus.  ?Respiratory:  Negative for chest tightness, cough and shortness of breath.   ?Cardiovascular:  Negative for chest pain, leg swelling and palpitations.  ?Gastrointestinal:  Negative for abdominal distention, abdominal pain, constipation, diarrhea, nausea and vomiting.  ?Endocrine: Negative for hot flashes.  ?Genitourinary:  Negative for difficulty urinating.   ?Musculoskeletal:  Negative for arthralgias.  ?Skin:  Negative for itching and rash.  ?Neurological:  Negative for dizziness, extremity weakness, headaches and numbness.  ?Hematological:  Negative for adenopathy. Does not bruise/bleed easily.  ?Psychiatric/Behavioral:  Negative for depression. The patient is not nervous/anxious.    ? ? ?PHYSICAL EXAMINATION ? ?ECOG PERFORMANCE STATUS: 1 - Symptomatic but  completely ambulatory ? ?Vitals:  ? 10/04/21 1340  ?BP: 133/71  ?Pulse: 69  ?Resp: 18  ?Temp: 97.7 ?F (36.5 ?C)  ?SpO2: 100%  ? ? ?Physical Exam ?Constitutional:   ?   General: She is not in acute distress. ?   Appearance: Normal appearance. She is not toxic-appearing.  ?HENT:  ?   Head: Normocephalic and atraumatic.  ?Eyes:  ?   General: No scleral icterus. ?Cardiovascular:  ?   Rate and Rhythm: Normal rate and regular rhythm.  ?   Pulses: Normal pulses.  ?   Heart sounds: Normal heart sounds.  ?Pulmonary:  ?   Effort: Pulmonary effort is normal.  ?   Breath sounds: Normal breath sounds.  ?Chest:  ?   Comments: Left breast status postlumpectomy and radiation no sign of local recurrence.  Right breast has a very small pinpoint nodule at 9:00 about 0.5 cm from the nipple.  Otherwise no concern throughout. ?Abdominal:  ?   General: Abdomen is flat. Bowel sounds are normal. There is no distension.  ?   Palpations: Abdomen is soft.  ?   Tenderness: There is no abdominal  tenderness.  ?Musculoskeletal:     ?   General: No swelling.  ?   Cervical back: Neck supple.  ?Lymphadenopathy:  ?   Cervical: No cervical adenopathy.  ?Skin: ?   General: Skin is warm and dry.  ?   Find

## 2021-10-15 ENCOUNTER — Other Ambulatory Visit: Payer: Self-pay

## 2021-10-15 MED ORDER — TAMOXIFEN CITRATE 20 MG PO TABS: 20.0000 mg | ORAL_TABLET | Freq: Every day | ORAL | 3 refills | Status: DC

## 2021-10-19 ENCOUNTER — Ambulatory Visit
Admission: RE | Admit: 2021-10-19 | Discharge: 2021-10-19 | Disposition: A | Discharge status: Home / Self Care | Payer: 59 | Source: Ambulatory Visit | Attending: Adult Health | Admitting: Adult Health

## 2021-10-19 DIAGNOSIS — Z170 Estrogen receptor positive status [ER+]: Secondary | ICD-10-CM

## 2021-10-19 DIAGNOSIS — N63 Unspecified lump in unspecified breast: Secondary | ICD-10-CM

## 2021-12-14 ENCOUNTER — Telehealth: Payer: Self-pay | Admitting: Adult Health

## 2021-12-14 NOTE — Telephone Encounter (Signed)
Scheduled appointment per 3/15 los. Left message.

## 2022-05-16 ENCOUNTER — Other Ambulatory Visit: Payer: Self-pay | Admitting: Adult Health

## 2022-05-16 DIAGNOSIS — Z9889 Other specified postprocedural states: Secondary | ICD-10-CM

## 2022-07-10 ENCOUNTER — Ambulatory Visit
Admission: RE | Admit: 2022-07-10 | Discharge: 2022-07-10 | Disposition: A | Discharge status: Home / Self Care | Payer: 59 | Source: Ambulatory Visit | Attending: Adult Health | Admitting: Adult Health

## 2022-07-10 DIAGNOSIS — Z9889 Other specified postprocedural states: Secondary | ICD-10-CM

## 2022-07-10 HISTORY — DX: Personal history of irradiation: Z92.3

## 2022-10-04 ENCOUNTER — Other Ambulatory Visit: Payer: Self-pay | Admitting: *Deleted

## 2022-10-04 DIAGNOSIS — Z170 Estrogen receptor positive status [ER+]: Secondary | ICD-10-CM

## 2022-10-07 ENCOUNTER — Telehealth: Payer: Self-pay | Admitting: Adult Health

## 2022-10-07 ENCOUNTER — Inpatient Hospital Stay (HOSPITAL_BASED_OUTPATIENT_CLINIC_OR_DEPARTMENT_OTHER): Payer: 59 | Admitting: Adult Health

## 2022-10-07 ENCOUNTER — Inpatient Hospital Stay: Payer: 59 | Attending: Adult Health

## 2022-10-07 ENCOUNTER — Encounter: Payer: Self-pay | Admitting: Adult Health

## 2022-10-07 VITALS — BP 130/64 | HR 65 | Temp 97.7°F | Resp 14 | Ht 65.0 in | Wt 133.4 lb

## 2022-10-07 DIAGNOSIS — Z803 Family history of malignant neoplasm of breast: Secondary | ICD-10-CM | POA: Insufficient documentation

## 2022-10-07 DIAGNOSIS — I10 Essential (primary) hypertension: Secondary | ICD-10-CM | POA: Insufficient documentation

## 2022-10-07 DIAGNOSIS — C50512 Malignant neoplasm of lower-outer quadrant of left female breast: Secondary | ICD-10-CM | POA: Diagnosis not present

## 2022-10-07 DIAGNOSIS — Z79810 Long term (current) use of selective estrogen receptor modulators (SERMs): Secondary | ICD-10-CM | POA: Diagnosis not present

## 2022-10-07 DIAGNOSIS — Z170 Estrogen receptor positive status [ER+]: Secondary | ICD-10-CM | POA: Insufficient documentation

## 2022-10-07 LAB — CBC WITH DIFFERENTIAL (CANCER CENTER ONLY)
Abs Immature Granulocytes: 0.02 10*3/uL (ref 0.00–0.07)
Basophils Absolute: 0 10*3/uL (ref 0.0–0.1)
Basophils Relative: 1 %
Eosinophils Absolute: 0.1 10*3/uL (ref 0.0–0.5)
Eosinophils Relative: 3 %
HCT: 40.6 % (ref 36.0–46.0)
Hemoglobin: 13.8 g/dL (ref 12.0–15.0)
Immature Granulocytes: 0 %
Lymphocytes Relative: 29 %
Lymphs Abs: 1.5 10*3/uL (ref 0.7–4.0)
MCH: 30.6 pg (ref 26.0–34.0)
MCHC: 34 g/dL (ref 30.0–36.0)
MCV: 90 fL (ref 80.0–100.0)
Monocytes Absolute: 0.3 10*3/uL (ref 0.1–1.0)
Monocytes Relative: 6 %
Neutro Abs: 3.2 10*3/uL (ref 1.7–7.7)
Neutrophils Relative %: 61 %
Platelet Count: 162 10*3/uL (ref 150–400)
RBC: 4.51 MIL/uL (ref 3.87–5.11)
RDW: 12.5 % (ref 11.5–15.5)
WBC Count: 5.1 10*3/uL (ref 4.0–10.5)
nRBC: 0 % (ref 0.0–0.2)

## 2022-10-07 LAB — CMP (CANCER CENTER ONLY)
ALT: 21 U/L (ref 0–44)
AST: 18 U/L (ref 15–41)
Albumin: 4.3 g/dL (ref 3.5–5.0)
Alkaline Phosphatase: 44 U/L (ref 38–126)
Anion gap: 6 (ref 5–15)
BUN: 14 mg/dL (ref 6–20)
CO2: 27 mmol/L (ref 22–32)
Calcium: 9.9 mg/dL (ref 8.9–10.3)
Chloride: 108 mmol/L (ref 98–111)
Creatinine: 0.65 mg/dL (ref 0.44–1.00)
GFR, Estimated: >60 mL/min (ref >60)
Glucose, Bld: 85 mg/dL (ref 70–99)
Potassium: 4.1 mmol/L (ref 3.5–5.1)
Sodium: 141 mmol/L (ref 135–145)
Total Bilirubin: 0.3 mg/dL (ref 0.3–1.2)
Total Protein: 6.7 g/dL (ref 6.5–8.1)

## 2022-10-07 NOTE — Telephone Encounter (Signed)
Scheduled appointments per 3/18 los. Left voicemail.

## 2022-10-07 NOTE — Progress Notes (Signed)
[facility]:    Amy Melter, MD [address] [place] [address]   DIAGNOSIS:  Cancer Staging  Malignant neoplasm of lower-outer quadrant of left breast of female, estrogen receptor positive ([place]) Staging form: Breast, AJCC 8th Edition - Clinical stage from 07/22/2019: Stage IA (cT1c, cN0, cM0, G2, ER+, PR+, HER2-) - Signed by Gardenia Phlegm, NP on 08/04/2019 Stage prefix: Initial diagnosis Histologic grading system: 3 grade system - Pathologic stage from 09/09/2019: Stage IA (pT1c, pN0, cM0, G2, ER+, PR+, HER2-) - Signed by Gardenia Phlegm, NP on 09/15/2019 Stage prefix: Initial diagnosis Histologic grading system: 3 grade system   SUMMARY OF ONCOLOGIC HISTORY: Amy Fisher, [place] woman status post left breast lower outer quadrant biopsy 07/22/2019 for a clinical T1c N0, stage IA invasive lobular carcinoma, E-cadherin negative, estrogen and progesterone receptor positive, HER-2 not amplified, with an MIB-1 of 2%   (1) genetics testing 08/18/2019 through the [facility] Breast Cancer STAT Panel + Common Hereditary Cancers Panels found no deleterious mutations in ATM, BRCA1, BRCA2, CDH1, CHEK2, PALB2, PTEN, STK11 and TP53, APC, ATM, AXIN2, BARD1, BMPR1A, BRCA1, BRCA2, BRIP1, CDH1, CDKN2A (p14ARF), CDKN2A (p16INK4a), CKD4, CHEK2, CTNNA1, DICER1, EPCAM (Deletion/duplication testing only), GREM1 (promoter region deletion/duplication testing only), KIT, MEN1, MLH1, MSH2, MSH3, MSH6, MUTYH, NBN, NF1, NHTL1, PALB2, PDGFRA, PMS2, POLD1, POLE, PTEN, RAD50, RAD51C, RAD51D, RNF43, SDHB, SDHC, SDHD, SMAD4, SMARCA4. STK11, TP53, TSC1, TSC2, and VHL.  The following genes were evaluated for sequence changes only: SDHA and HOXB13 c.251G>A variant only.   (2) status post left lumpectomy and sentinel lymph node sampling 08/31/2019 for a pT1c pN0, stage IA invasive ductal carcinoma, grade 2, with focally positive posterior and superior margins.              (a) a total of 8 left axillary lymph nodes were removed             (b) additional surgery 09/09/2019 showed a 0.2 cm focus of invasive lobular carcinoma 0.3 cm from the new left inferior margin   (3) Oncotype score of 13 predicts a risk of recurrence outside the breast in the next 9 years of 4% if the patient's only systemic therapy is antiestrogens for 5 years.  It also predicts no benefit from chemotherapy.   (4) adjuvant radiation 10/14/2019 through 11/10/2019   (5) tamoxifen started 11/22/2019  CURRENT THERAPY: Tamoxifen  INTERVAL HISTORY: Amy Fisher 62 y.o. female returns for follow-up of her history of left-sided estrogen positive breast cancer.  She continues on tamoxifen daily.  She denies any significant side effects from taking the tamoxifen and tolerates it well.  She continues to exercise regularly and is up-to-date with her cancer screenings.  Her most recent mammogram occurred on July 10, 2022 demonstrating no mammographic evidence of malignancy and breast density category C.   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  MEDICAL HISTORY: Past Medical History:  Diagnosis Date   Asthma    Cancer ([place])    Breast   Cervical dysplasia    lgsil 2008, ascus 2009, normal Pap smears afterwards   Fibroid    Hypertension    Personal history of radiation therapy  SURGICAL HISTORY: Past Surgical History:  Procedure Laterality Date   BREAST BIOPSY Left 06/2019   BREAST LUMPECTOMY Left 06/2020   BREAST LUMPECTOMY WITH RADIOACTIVE SEED AND SENTINEL LYMPH NODE BIOPSY Left 08/31/2019   Procedure: LEFT BREAST LUMPECTOMY WITH RADIOACTIVE SEED AND LEFT AXILLARY SENTINEL LYMPH NODE BIOPSY;   Surgeon: Erroll Luna, MD;  Location: [facility];  Service: General;  Laterality: Left;   BREAST SURGERY     Breast Biopsy   IUD REMOVAL  07/2017   Mirena   PILONIDAL CYST EXCISION     RE-EXCISION OF BREAST LUMPECTOMY Left 09/09/2019   Procedure: LEFT BREAST RE-EXCISION OF LUMPECTOMY AND EVACUATION OF LEFT AXILLARY SEROMA;  Surgeon: Erroll Luna, MD;  Location: [facility];  Service: General;  Laterality: Left;    SOCIAL HISTORY: Social History   Socioeconomic History   Marital status: Divorced    Spouse name: Not on file   Number of children: Not on file   Years of education: Not on file   Highest education level: Not on file  Occupational History   Not on file  Tobacco Use   Smoking status: Never   Smokeless tobacco: Never  Vaping Use   Vaping Use: Never used  Substance and Sexual Activity   Alcohol use: Yes    Alcohol/week: 0.0 standard drinks of alcohol    Comment: OCC, WINE   Drug use: No   Sexual activity: Yes    Partners: Male    Birth control/protection: Post-menopausal    Comment: -1st intercourse 62 yo-Fewer than 5 partners  Other Topics Concern   Not on file  Social History Narrative   Not on file   Social Determinants of Health   Financial Resource Strain: Not on file  Food Insecurity: Not on file  Transportation Needs: Not on file  Physical Activity: Not on file  Stress: Not on file  Social Connections: Not on file  Intimate Partner Violence: Not on file    FAMILY HISTORY: Family History  Problem Relation Age of Onset   Cancer Maternal Grandmother        ? type   Breast cancer Mother 19   Celiac disease Sister    Alzheimer's disease Father    Cancer Paternal Aunt        ? type   Alzheimer's disease Paternal Grandfather    Breast cancer Other        dx70s    Review of Systems  Constitutional:  Negative for appetite change, chills, fatigue, fever and unexpected weight change.  HENT:   Negative for  hearing loss, lump/mass and trouble swallowing.   Eyes:  Negative for eye problems and icterus.  Respiratory:  Negative for chest tightness, cough and shortness of breath.   Cardiovascular:  Negative for chest pain, leg swelling and palpitations.  Gastrointestinal:  Negative for abdominal distention, abdominal pain, constipation, diarrhea, nausea and vomiting.  Endocrine: Negative for hot flashes.  Genitourinary:  Negative for difficulty urinating.   Musculoskeletal:  Negative for arthralgias.  Skin:  Negative for itching and rash.  Neurological:  Negative for dizziness, extremity weakness, headaches and numbness.  Hematological:  Negative for adenopathy. Does not bruise/bleed easily.  Psychiatric/Behavioral:  Negative for depression. The patient is not nervous/anxious.       PHYSICAL EXAMINATION  ECOG PERFORMANCE STATUS: 1 - Symptomatic but completely ambulatory  Vitals:   10/07/22 1339  BP: 130/64  Pulse: 65  Resp: 14  Temp: 97.7 F (36.5 C)  SpO2: 100%  Physical Exam Constitutional:      General: She is not in acute distress.    Appearance: Normal appearance. She is not toxic-appearing.  HENT:     Head: Normocephalic and atraumatic.  Eyes:     General: No scleral icterus. Cardiovascular:     Rate and Rhythm: Normal rate and regular rhythm.     Pulses: Normal pulses.     Heart sounds: Normal heart sounds.  Pulmonary:     Effort: Pulmonary effort is normal.     Breath sounds: Normal breath sounds.  Chest:     Comments: Left breast status postlumpectomy and radiation no sign of local recurrence right breast is benign. Abdominal:     General: Abdomen is flat. Bowel sounds are normal. There is no distension.     Palpations: Abdomen is soft.     Tenderness: There is no abdominal tenderness.  Musculoskeletal:        General: No swelling.     Cervical back: Neck supple.  Lymphadenopathy:     Cervical: No cervical adenopathy.  Skin:    General: Skin is warm and  dry.     Findings: No rash.  Neurological:     General: No focal deficit present.     Mental Status: She is alert.  Psychiatric:        Mood and Affect: Mood normal.        Behavior: Behavior normal.     LABORATORY DATA:  CBC    Component Value Date/Time   WBC 5.1 10/07/2022 1330   WBC 5.9 10/04/2020 1314   RBC 4.51 10/07/2022 1330   HGB 13.8 10/07/2022 1330   HCT 40.6 10/07/2022 1330   PLT 162 10/07/2022 1330   MCV 90.0 10/07/2022 1330   MCH 30.6 10/07/2022 1330   MCHC 34.0 10/07/2022 1330   RDW 12.5 10/07/2022 1330   LYMPHSABS 1.5 10/07/2022 1330   MONOABS 0.3 10/07/2022 1330   EOSABS 0.1 10/07/2022 1330   BASOSABS 0.0 10/07/2022 1330    CMP     Component Value Date/Time   NA 141 10/07/2022 1330   K 4.1 10/07/2022 1330   CL 108 10/07/2022 1330   CO2 27 10/07/2022 1330   GLUCOSE 85 10/07/2022 1330   BUN 14 10/07/2022 1330   CREATININE 0.65 10/07/2022 1330   CREATININE 0.69 06/03/2012 0940   CALCIUM 9.9 10/07/2022 1330   PROT 6.7 10/07/2022 1330   ALBUMIN 4.3 10/07/2022 1330   AST 18 10/07/2022 1330   ALT 21 10/07/2022 1330   ALKPHOS 44 10/07/2022 1330   BILITOT 0.3 10/07/2022 1330   GFRNONAA >60 10/07/2022 1330   GFRAA >60 02/23/2020 1025       ASSESSMENT and THERAPY PLAN:   Malignant neoplasm of lower-outer quadrant of left breast of female, estrogen receptor positive ([place]) Amy Fisher is a 62 year old woman with stage Ia ER/PR positive breast cancer diagnosed in December 2020.  She is status postlumpectomy followed by adjuvant radiation and antiestrogen therapy with tamoxifen which she began in May 2021.  She has no clinical or radiographic signs of breast cancer recurrence.  She will continue tamoxifen daily which she is tolerating well.  She is due for mammogram in December 2024.  I recommended continued healthy diet and exercise.  She will return in 1 year for continued labs and follow-up.  All questions were answered. The patient knows to call  the clinic with any problems, questions or concerns. We can certainly see the patient much sooner if necessary.  Total  encounter time:20 minutes*in face-to-face visit time, chart review, lab review, care coordination, order entry, and documentation of the encounter time.    Wilber Bihari, NP 10/07/22 2:07 PM Medical Oncology and Hematology [facility] [place], [place] [address] Tel. [phone]    Fax. [phone]  *Total Encounter Time as defined by the Centers for Medicare and Medicaid Services includes, in addition to the face-to-face time of a patient visit (documented in the note above) non-face-to-face time: obtaining and reviewing outside history, ordering and reviewing medications, tests or procedures, care coordination (communications with other health care professionals or caregivers) and documentation in the medical record.

## 2022-10-07 NOTE — Assessment & Plan Note (Signed)
Amy Fisher is a 62 year old woman with stage Ia ER/PR positive breast cancer diagnosed in December 2020.  She is status postlumpectomy followed by adjuvant radiation and antiestrogen therapy with tamoxifen which she began in May 2021.  She has no clinical or radiographic signs of breast cancer recurrence.  She will continue tamoxifen daily which she is tolerating well.  She is due for mammogram in December 2024.  I recommended continued healthy diet and exercise.  She will return in 1 year for continued labs and follow-up.

## 2022-11-04 ENCOUNTER — Other Ambulatory Visit: Payer: Self-pay | Admitting: Adult Health

## 2023-04-07 DIAGNOSIS — Z0000 Encounter for general adult medical examination without abnormal findings: Secondary | ICD-10-CM | POA: Diagnosis not present

## 2023-04-07 DIAGNOSIS — I10 Essential (primary) hypertension: Secondary | ICD-10-CM | POA: Diagnosis not present

## 2023-04-07 DIAGNOSIS — Z13220 Encounter for screening for lipoid disorders: Secondary | ICD-10-CM | POA: Diagnosis not present

## 2023-05-20 ENCOUNTER — Other Ambulatory Visit: Payer: Self-pay | Admitting: Adult Health

## 2023-05-20 DIAGNOSIS — Z1231 Encounter for screening mammogram for malignant neoplasm of breast: Secondary | ICD-10-CM

## 2023-07-14 ENCOUNTER — Ambulatory Visit
Admission: RE | Admit: 2023-07-14 | Discharge: 2023-07-14 | Disposition: A | Discharge status: Home / Self Care | Payer: BC Managed Care – PPO | Source: Ambulatory Visit | Attending: Adult Health | Admitting: Adult Health

## 2023-07-14 DIAGNOSIS — Z1231 Encounter for screening mammogram for malignant neoplasm of breast: Secondary | ICD-10-CM

## 2023-07-31 DIAGNOSIS — R87613 High grade squamous intraepithelial lesion on cytologic smear of cervix (HGSIL): Secondary | ICD-10-CM | POA: Diagnosis not present

## 2023-07-31 DIAGNOSIS — Z01419 Encounter for gynecological examination (general) (routine) without abnormal findings: Secondary | ICD-10-CM | POA: Diagnosis not present

## 2023-07-31 DIAGNOSIS — N870 Mild cervical dysplasia: Secondary | ICD-10-CM | POA: Diagnosis not present

## 2023-07-31 DIAGNOSIS — Z1151 Encounter for screening for human papillomavirus (HPV): Secondary | ICD-10-CM | POA: Diagnosis not present

## 2023-08-13 ENCOUNTER — Other Ambulatory Visit: Payer: Self-pay

## 2023-08-13 ENCOUNTER — Encounter: Payer: Self-pay | Admitting: Adult Health

## 2023-08-13 MED ORDER — TAMOXIFEN CITRATE 20 MG PO TABS: 20.0000 mg | ORAL_TABLET | Freq: Every day | ORAL | 3 refills | Status: DC

## 2023-09-12 DIAGNOSIS — R87612 Low grade squamous intraepithelial lesion on cytologic smear of cervix (LGSIL): Secondary | ICD-10-CM | POA: Diagnosis not present

## 2023-10-06 ENCOUNTER — Other Ambulatory Visit: Payer: Self-pay | Admitting: *Deleted

## 2023-10-06 DIAGNOSIS — C50512 Malignant neoplasm of lower-outer quadrant of left female breast: Secondary | ICD-10-CM

## 2023-10-07 ENCOUNTER — Inpatient Hospital Stay: Payer: BC Managed Care – PPO | Attending: Adult Health

## 2023-10-07 ENCOUNTER — Encounter: Payer: Self-pay | Admitting: Adult Health

## 2023-10-07 ENCOUNTER — Inpatient Hospital Stay: Payer: BC Managed Care – PPO | Admitting: Adult Health

## 2023-10-07 VITALS — BP 157/76 | HR 65 | Resp 18 | Ht 65.0 in | Wt 141.5 lb

## 2023-10-07 DIAGNOSIS — Z79810 Long term (current) use of selective estrogen receptor modulators (SERMs): Secondary | ICD-10-CM | POA: Diagnosis not present

## 2023-10-07 DIAGNOSIS — I10 Essential (primary) hypertension: Secondary | ICD-10-CM | POA: Diagnosis not present

## 2023-10-07 DIAGNOSIS — C50512 Malignant neoplasm of lower-outer quadrant of left female breast: Secondary | ICD-10-CM | POA: Diagnosis not present

## 2023-10-07 DIAGNOSIS — Z170 Estrogen receptor positive status [ER+]: Secondary | ICD-10-CM | POA: Diagnosis not present

## 2023-10-07 DIAGNOSIS — R5383 Other fatigue: Secondary | ICD-10-CM | POA: Insufficient documentation

## 2023-10-07 DIAGNOSIS — Z923 Personal history of irradiation: Secondary | ICD-10-CM | POA: Diagnosis not present

## 2023-10-07 DIAGNOSIS — Z803 Family history of malignant neoplasm of breast: Secondary | ICD-10-CM | POA: Insufficient documentation

## 2023-10-07 DIAGNOSIS — Z1721 Progesterone receptor positive status: Secondary | ICD-10-CM | POA: Diagnosis not present

## 2023-10-07 DIAGNOSIS — Z1732 Human epidermal growth factor receptor 2 negative status: Secondary | ICD-10-CM | POA: Insufficient documentation

## 2023-10-07 LAB — CBC WITH DIFFERENTIAL (CANCER CENTER ONLY)
Abs Immature Granulocytes: 0.02 10*3/uL (ref 0.00–0.07)
Basophils Absolute: 0.1 10*3/uL (ref 0.0–0.1)
Basophils Relative: 1 %
Eosinophils Absolute: 0.1 10*3/uL (ref 0.0–0.5)
Eosinophils Relative: 2 %
HCT: 44.8 % (ref 36.0–46.0)
Hemoglobin: 15 g/dL (ref 12.0–15.0)
Immature Granulocytes: 0 %
Lymphocytes Relative: 33 %
Lymphs Abs: 1.8 10*3/uL (ref 0.7–4.0)
MCH: 30 pg (ref 26.0–34.0)
MCHC: 33.5 g/dL (ref 30.0–36.0)
MCV: 89.6 fL (ref 80.0–100.0)
Monocytes Absolute: 0.3 10*3/uL (ref 0.1–1.0)
Monocytes Relative: 5 %
Neutro Abs: 3.2 10*3/uL (ref 1.7–7.7)
Neutrophils Relative %: 59 %
Platelet Count: 148 10*3/uL — ABNORMAL LOW (ref 150–400)
RBC: 5 MIL/uL (ref 3.87–5.11)
RDW: 12.4 % (ref 11.5–15.5)
WBC Count: 5.5 10*3/uL (ref 4.0–10.5)
nRBC: 0 % (ref 0.0–0.2)

## 2023-10-07 LAB — CMP (CANCER CENTER ONLY)
ALT: 31 U/L (ref 0–44)
AST: 29 U/L (ref 15–41)
Albumin: 4.6 g/dL (ref 3.5–5.0)
Alkaline Phosphatase: 51 U/L (ref 38–126)
Anion gap: 6 (ref 5–15)
BUN: 12 mg/dL (ref 6–20)
CO2: 28 mmol/L (ref 22–32)
Calcium: 10.5 mg/dL — ABNORMAL HIGH (ref 8.9–10.3)
Chloride: 104 mmol/L (ref 98–111)
Creatinine: 0.67 mg/dL (ref 0.44–1.00)
GFR, Estimated: >60 mL/min (ref >60)
Glucose, Bld: 91 mg/dL (ref 70–99)
Potassium: 4.5 mmol/L (ref 3.5–5.1)
Sodium: 138 mmol/L (ref 135–145)
Total Bilirubin: 0.5 mg/dL (ref 0.0–1.2)
Total Protein: 7.2 g/dL (ref 6.5–8.1)

## 2023-10-07 NOTE — Progress Notes (Unsigned)
 [facility] Cancer Follow up:    Amy Rua, MD [address] [place] [address]   DIAGNOSIS: Cancer Staging  Malignant neoplasm of lower-outer quadrant of left breast of female, estrogen receptor positive (HCC) Staging form: Breast, AJCC 8th Edition - Clinical stage from 07/22/2019: Stage IA (cT1c, cN0, cM0, G2, ER+, PR+, HER2-) - Signed by Loa Socks, NP on 08/04/2019 Stage prefix: Initial diagnosis Histologic grading system: 3 grade system - Pathologic stage from 09/09/2019: Stage IA (pT1c, pN0, cM0, G2, ER+, PR+, HER2-) - Signed by Loa Socks, NP on 09/15/2019 Stage prefix: Initial diagnosis Histologic grading system: 3 grade system   SUMMARY OF ONCOLOGIC HISTORY: Amy Fisher,  woman status post left breast lower outer quadrant biopsy 07/22/2019 for a clinical T1c N0, stage IA invasive lobular carcinoma, E-cadherin negative, estrogen and progesterone receptor positive, HER-2 not amplified, with an MIB-1 of 2%   (1) genetics testing 08/18/2019 through the [facility] Breast Cancer STAT Panel + Common Hereditary Cancers Panels found no deleterious mutations in ATM, BRCA1, BRCA2, CDH1, CHEK2, PALB2, PTEN, STK11 and TP53, APC, ATM, AXIN2, BARD1, BMPR1A, BRCA1, BRCA2, BRIP1, CDH1, CDKN2A (p14ARF), CDKN2A (p16INK4a), CKD4, CHEK2, CTNNA1, DICER1, EPCAM (Deletion/duplication testing only), GREM1 (promoter region deletion/duplication testing only), KIT, MEN1, MLH1, MSH2, MSH3, MSH6, MUTYH, NBN, NF1, NHTL1, PALB2, PDGFRA, PMS2, POLD1, POLE, PTEN, RAD50, RAD51C, RAD51D, RNF43, SDHB, SDHC, SDHD, SMAD4, SMARCA4. STK11, TP53, TSC1, TSC2, and VHL.  The following genes were evaluated for sequence changes only: SDHA and HOXB13 c.251G>A variant only.   (2) status post left lumpectomy and sentinel lymph node sampling 08/31/2019 for a pT1c pN0, stage IA invasive ductal carcinoma, grade 2, with focally positive posterior and superior margins.              (a) a total of 8 left axillary lymph nodes were removed             (Fisher) additional surgery 09/09/2019 showed a 0.2 cm focus of invasive lobular carcinoma 0.3 cm from the new left inferior margin   (3) Oncotype score of 13 predicts a risk of recurrence outside the breast in the next 9 years of 4% if the patient's only systemic therapy is antiestrogens for 5 years.  It also predicts no benefit from chemotherapy.   (4) adjuvant radiation 10/14/2019 through 11/10/2019   (5) tamoxifen started 11/22/2019  CURRENT THERAPY: Tamoxifen  INTERVAL HISTORY:  Discussed the use of AI scribe software for clinical note transcription with the patient, who gave verbal consent to proceed.  Amy Fisher Reasons 63 y.o. female returns for f/u of her history of breast cancer.  Her most recent mammogram occurred on 07/18/2023 demonstrating no evidence of malignancy and breast density category C.     Patient Active Problem List   Diagnosis Date Noted  . 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 Primary hypertension 10/29/2020  . Genetic testing 08/19/2019  . Family history of breast cancer   . Malignant neoplasm of lower-outer quadrant of left breast of female, estrogen receptor positive (HCC) 08/04/2019    has no known allergies.  MEDICAL HISTORY: Past Medical History:  Diagnosis Date  . Asthma   . Cancer (HCC)    Breast  . Cervical dysplasia    lgsil 2008, ascus 2009, normal Pap smears afterwards  . Fibroid   . Hypertension   . Personal history of radiation therapy     SURGICAL HISTORY: Past Surgical History:  Procedure Laterality Date  . BREAST BIOPSY Left 06/2019  .  BREAST LUMPECTOMY Left 06/2020  . BREAST LUMPECTOMY WITH RADIOACTIVE SEED AND SENTINEL LYMPH NODE BIOPSY Left 08/31/2019   Procedure: LEFT BREAST LUMPECTOMY WITH RADIOACTIVE SEED AND LEFT AXILLARY SENTINEL LYMPH NODE BIOPSY;  Surgeon: Harriette Bouillon, MD;  Location: [facility];  Service: General;  Laterality: Left;  . BREAST  SURGERY     Breast Biopsy  . IUD REMOVAL  07/2017   Mirena  . PILONIDAL CYST EXCISION    . RE-EXCISION OF BREAST LUMPECTOMY Left 09/09/2019   Procedure: LEFT BREAST RE-EXCISION OF LUMPECTOMY AND EVACUATION OF LEFT AXILLARY SEROMA;  Surgeon: Harriette Bouillon, MD;  Location: [facility];  Service: General;  Laterality: Left;    SOCIAL HISTORY: Social History   Socioeconomic History  . Marital status: Divorced    Spouse name: Not on file  . Number of children: Not on file  . Years of education: Not on file  . Highest education level: Not on file  Occupational History  . Not on file  Tobacco Use  . Smoking status: Never  . Smokeless tobacco: Never  Vaping Use  . Vaping status: Never Used  Substance and Sexual Activity  . Alcohol use: Yes    Alcohol/week: 0.0 standard drinks of alcohol    Comment: OCC, WINE  . Drug use: No  . Sexual activity: Yes    Partners: Male    Birth control/protection: Post-menopausal    Comment: -1st intercourse 63 yo-Fewer than 5 partners  Other Topics Concern  . Not on file  Social History Narrative  . Not on file   Social Drivers of Health   Financial Resource Strain: Not on file  Food Insecurity: No Food Insecurity (10/30/2020)   Received from [facility], [facility]   Hunger Vital Sign   . Worried About Programme researcher, broadcasting/film/video in the Last Year: Never true   . Ran Out of Food in the Last Year: Never true  Transportation Needs: Not on file  Physical Activity: Not on file  Stress: Not on file  Social Connections: Unknown (12/04/2021)   Received from [facility], [facility]   Social Network   . Social Network: Not on file  Intimate Partner Violence: Unknown (10/26/2021)   Received from [facility], [facility]   HITS   . Physically Hurt: Not on file   . Insult or Talk Down To: Not on file   . Threaten Physical Harm: Not on file   . Scream or Curse: Not on file    FAMILY HISTORY: Family History  Problem Relation  Age of Onset  . Cancer Maternal Grandmother        ? type  . Breast cancer Mother 47  . Celiac disease Sister   . Alzheimer's disease Father   . Cancer Paternal Aunt        ? type  . Alzheimer's disease Paternal Grandfather   . Breast cancer Other        dx70s    Review of Systems - Oncology    PHYSICAL EXAMINATION    There were no vitals filed for this visit.  Physical Exam  LABORATORY DATA:  CBC    Component Value Date/Time   WBC 5.1 10/07/2022 1330   WBC 5.9 10/04/2020 1314   RBC 4.51 10/07/2022 1330   HGB 13.8 10/07/2022 1330   HCT 40.6 10/07/2022 1330   PLT 162 10/07/2022 1330   MCV 90.0 10/07/2022 1330   MCH 30.6 10/07/2022 1330   MCHC 34.0 10/07/2022 1330  RDW 12.5 10/07/2022 1330   LYMPHSABS 1.5 10/07/2022 1330   MONOABS 0.3 10/07/2022 1330   EOSABS 0.1 10/07/2022 1330   BASOSABS 0.0 10/07/2022 1330    CMP     Component Value Date/Time   NA 141 10/07/2022 1330   K 4.1 10/07/2022 1330   CL 108 10/07/2022 1330   CO2 27 10/07/2022 1330   GLUCOSE 85 10/07/2022 1330   BUN 14 10/07/2022 1330   CREATININE 0.65 10/07/2022 1330   CREATININE 0.69 06/03/2012 0940   CALCIUM 9.9 10/07/2022 1330   PROT 6.7 10/07/2022 1330   ALBUMIN 4.3 10/07/2022 1330   AST 18 10/07/2022 1330   ALT 21 10/07/2022 1330   ALKPHOS 44 10/07/2022 1330   BILITOT 0.3 10/07/2022 1330   GFRNONAA >60 10/07/2022 1330   GFRAA >60 02/23/2020 1025       PENDING LABS:   RADIOGRAPHIC STUDIES:  No results found.   PATHOLOGY:     ASSESSMENT and THERAPY PLAN:   No problem-specific Assessment & Plan notes found for this encounter.   No orders of the defined types were placed in this encounter.   All questions were answered. The patient knows to call the clinic with any problems, questions or concerns. We can certainly see the patient much sooner if necessary. This note was electronically signed. Noreene Filbert, NP 10/07/2023

## 2023-10-10 NOTE — Assessment & Plan Note (Addendum)
 Amy Fisher is a 63 year old woman with stage Ia ER/PR positive breast cancer diagnosed in December 2020.  She is status postlumpectomy followed by adjuvant radiation and antiestrogen therapy with tamoxifen which she began in May 2021.  Breast Cancer (Stage 1A) Stage 1A breast cancer treated with surgery. On tamoxifen since May 2021. Discussed breast cancer index test to evaluate benefit of extending antiestrogen therapy. No clinical or radiographic sign of breast cancer recurrence.   - Continue tamoxifen for one more year. - Consider breast cancer index test to evaluate the benefit of extending antiestrogen therapy. - Continue annual mammograms every December unless advised otherwise by the radiologist. - Schedule follow-up appointment in 1 year's time for breast exam and monitoring.  Fatigue Fatigue likely due to tamoxifen. Discussed other potential causes including low vitamin B12, vitamin D, and thyroid function. Advised on hydration and blood pressure monitoring. - Check vitamin B12, vitamin D, and thyroid levels during annual lab work. - Ensure adequate hydration by drinking 64 ounces of water daily. - Monitor blood pressure at home at different times of the day.  Hypertension On antihypertensive medication. Blood pressure elevated at 157 mmHg during visit. Advised home monitoring to ensure well-controlled blood pressure. - Monitor blood pressure at home at different times of the day. - Continue f/u with PCP for BP management.

## 2024-01-20 ENCOUNTER — Other Ambulatory Visit: Payer: Self-pay | Admitting: *Deleted

## 2024-01-20 ENCOUNTER — Encounter: Payer: Self-pay | Admitting: Adult Health

## 2024-01-20 MED ORDER — TAMOXIFEN CITRATE 20 MG PO TABS: 20.0000 mg | ORAL_TABLET | Freq: Every day | ORAL | 0 refills | Status: DC

## 2024-01-27 ENCOUNTER — Other Ambulatory Visit: Payer: Self-pay | Admitting: *Deleted

## 2024-01-27 MED ORDER — TAMOXIFEN CITRATE 20 MG PO TABS: 20.0000 mg | ORAL_TABLET | Freq: Every day | ORAL | 3 refills | Status: DC

## 2024-01-29 DIAGNOSIS — Z1151 Encounter for screening for human papillomavirus (HPV): Secondary | ICD-10-CM | POA: Diagnosis not present

## 2024-01-29 DIAGNOSIS — R87612 Low grade squamous intraepithelial lesion on cytologic smear of cervix (LGSIL): Secondary | ICD-10-CM | POA: Diagnosis not present

## 2024-02-02 ENCOUNTER — Other Ambulatory Visit: Payer: Self-pay | Admitting: *Deleted

## 2024-02-02 MED ORDER — TAMOXIFEN CITRATE 20 MG PO TABS: 20.0000 mg | ORAL_TABLET | Freq: Every day | ORAL | 0 refills | Status: DC

## 2024-02-06 ENCOUNTER — Telehealth: Payer: Self-pay | Admitting: *Deleted

## 2024-02-06 NOTE — Telephone Encounter (Signed)
 Pt medlist was faxed to [facility] to start process for pt medication delivery for Tamoxifen . Faxed to [phone]. Confirmation of receipt of delivery received

## 2024-02-10 ENCOUNTER — Other Ambulatory Visit: Payer: Self-pay | Admitting: *Deleted

## 2024-02-10 MED ORDER — TAMOXIFEN CITRATE 20 MG PO TABS: 20.0000 mg | ORAL_TABLET | Freq: Every day | ORAL | 3 refills | Status: AC

## 2024-02-20 DIAGNOSIS — R87613 High grade squamous intraepithelial lesion on cytologic smear of cervix (HGSIL): Secondary | ICD-10-CM | POA: Diagnosis not present

## 2024-02-20 DIAGNOSIS — N879 Dysplasia of cervix uteri, unspecified: Secondary | ICD-10-CM | POA: Diagnosis not present

## 2024-03-17 DIAGNOSIS — J398 Other specified diseases of upper respiratory tract: Secondary | ICD-10-CM | POA: Diagnosis not present

## 2024-03-25 DIAGNOSIS — J386 Stenosis of larynx: Secondary | ICD-10-CM | POA: Diagnosis not present

## 2024-03-29 DIAGNOSIS — N879 Dysplasia of cervix uteri, unspecified: Secondary | ICD-10-CM | POA: Diagnosis not present

## 2024-03-30 DIAGNOSIS — N8003 Adenomyosis of the uterus: Secondary | ICD-10-CM | POA: Diagnosis not present

## 2024-03-30 DIAGNOSIS — D259 Leiomyoma of uterus, unspecified: Secondary | ICD-10-CM | POA: Diagnosis not present

## 2024-03-30 DIAGNOSIS — N7011 Chronic salpingitis: Secondary | ICD-10-CM | POA: Diagnosis not present

## 2024-03-30 DIAGNOSIS — N879 Dysplasia of cervix uteri, unspecified: Secondary | ICD-10-CM | POA: Diagnosis not present

## 2024-03-30 DIAGNOSIS — N8312 Corpus luteum cyst of left ovary: Secondary | ICD-10-CM | POA: Diagnosis not present

## 2024-04-12 DIAGNOSIS — Z48816 Encounter for surgical aftercare following surgery on the genitourinary system: Secondary | ICD-10-CM | POA: Diagnosis not present

## 2024-04-12 DIAGNOSIS — N879 Dysplasia of cervix uteri, unspecified: Secondary | ICD-10-CM | POA: Diagnosis not present

## 2024-05-05 ENCOUNTER — Encounter: Payer: Self-pay | Admitting: Adult Health

## 2024-06-01 ENCOUNTER — Other Ambulatory Visit: Payer: Self-pay | Admitting: Adult Health

## 2024-06-01 DIAGNOSIS — Z1231 Encounter for screening mammogram for malignant neoplasm of breast: Secondary | ICD-10-CM

## 2024-07-07 ENCOUNTER — Encounter: Payer: Self-pay | Admitting: Adult Health

## 2024-07-26 ENCOUNTER — Ambulatory Visit
Admission: RE | Admit: 2024-07-26 | Discharge: 2024-07-26 | Disposition: A | Discharge status: Home / Self Care | Source: Ambulatory Visit | Attending: Adult Health | Admitting: Adult Health

## 2024-07-26 DIAGNOSIS — Z1231 Encounter for screening mammogram for malignant neoplasm of breast: Secondary | ICD-10-CM

## 2024-10-08 ENCOUNTER — Inpatient Hospital Stay

## 2024-10-08 ENCOUNTER — Encounter: Admitting: Adult Health

## 2024-10-08 ENCOUNTER — Other Ambulatory Visit
